# Patient Record
Sex: Female | Born: 1955 | Race: White | Hispanic: No | Marital: Married | Smoking: Never smoker
Health system: Southern US, Community
[De-identification: ages and names within clinical notes are randomized; demographics above are authoritative.]

## PROBLEM LIST (undated history)

## (undated) DIAGNOSIS — N393 Stress incontinence (female) (male): Secondary | ICD-10-CM

## (undated) DIAGNOSIS — IMO0002 Reserved for concepts with insufficient information to code with codable children: Secondary | ICD-10-CM

## (undated) DIAGNOSIS — N841 Polyp of cervix uteri: Secondary | ICD-10-CM

## (undated) HISTORY — DX: Reserved for concepts with insufficient information to code with codable children: IMO0002

## (undated) HISTORY — PX: CYSTOSCOPY: SUR368

## (undated) HISTORY — PX: KNEE SURGERY: SHX244

## (undated) HISTORY — DX: Stress incontinence (female) (male): N39.3

## (undated) HISTORY — DX: Polyp of cervix uteri: N84.1

---

## 1999-04-28 ENCOUNTER — Other Ambulatory Visit: Admission: RE | Admit: 1999-04-28 | Discharge: 1999-04-28 | Payer: Self-pay | Admitting: Obstetrics & Gynecology

## 1999-06-02 ENCOUNTER — Encounter: Admission: RE | Admit: 1999-06-02 | Discharge: 1999-06-02 | Payer: Self-pay | Admitting: Obstetrics & Gynecology

## 1999-06-02 ENCOUNTER — Encounter: Payer: Self-pay | Admitting: Obstetrics & Gynecology

## 2001-01-26 ENCOUNTER — Other Ambulatory Visit: Admission: RE | Admit: 2001-01-26 | Discharge: 2001-01-26 | Payer: Self-pay | Admitting: Obstetrics & Gynecology

## 2001-05-02 ENCOUNTER — Encounter: Payer: Self-pay | Admitting: Obstetrics & Gynecology

## 2001-05-02 ENCOUNTER — Encounter: Admission: RE | Admit: 2001-05-02 | Discharge: 2001-05-02 | Payer: Self-pay | Admitting: Obstetrics & Gynecology

## 2002-06-25 ENCOUNTER — Other Ambulatory Visit: Admission: RE | Admit: 2002-06-25 | Discharge: 2002-06-25 | Payer: Self-pay | Admitting: Obstetrics & Gynecology

## 2003-02-14 ENCOUNTER — Encounter: Payer: Self-pay | Admitting: Obstetrics & Gynecology

## 2003-02-14 ENCOUNTER — Encounter: Admission: RE | Admit: 2003-02-14 | Discharge: 2003-02-14 | Payer: Self-pay | Admitting: Obstetrics & Gynecology

## 2004-06-15 ENCOUNTER — Encounter: Admission: RE | Admit: 2004-06-15 | Discharge: 2004-06-15 | Payer: Self-pay | Admitting: Obstetrics & Gynecology

## 2005-09-27 ENCOUNTER — Encounter: Admission: RE | Admit: 2005-09-27 | Discharge: 2005-09-27 | Payer: Self-pay | Admitting: Obstetrics & Gynecology

## 2006-12-19 ENCOUNTER — Encounter: Admission: RE | Admit: 2006-12-19 | Discharge: 2006-12-19 | Payer: Self-pay | Admitting: Obstetrics & Gynecology

## 2007-11-20 ENCOUNTER — Ambulatory Visit: Payer: Self-pay | Admitting: Gastroenterology

## 2007-12-04 ENCOUNTER — Ambulatory Visit: Payer: Self-pay | Admitting: Gastroenterology

## 2007-12-04 HISTORY — PX: COLONOSCOPY: SHX174

## 2008-02-22 ENCOUNTER — Encounter: Admission: RE | Admit: 2008-02-22 | Discharge: 2008-02-22 | Payer: Self-pay | Admitting: Family Medicine

## 2008-07-22 ENCOUNTER — Encounter: Admission: RE | Admit: 2008-07-22 | Discharge: 2008-07-22 | Payer: Self-pay | Admitting: Obstetrics & Gynecology

## 2010-01-05 ENCOUNTER — Encounter: Admission: RE | Admit: 2010-01-05 | Discharge: 2010-01-05 | Payer: Self-pay | Admitting: Obstetrics & Gynecology

## 2011-05-10 ENCOUNTER — Other Ambulatory Visit: Payer: Self-pay | Admitting: Obstetrics & Gynecology

## 2011-05-10 DIAGNOSIS — Z1231 Encounter for screening mammogram for malignant neoplasm of breast: Secondary | ICD-10-CM

## 2011-05-17 ENCOUNTER — Ambulatory Visit
Admission: RE | Admit: 2011-05-17 | Discharge: 2011-05-17 | Disposition: A | Discharge status: Home / Self Care | Payer: BC Managed Care – PPO | Source: Ambulatory Visit | Attending: Obstetrics & Gynecology | Admitting: Obstetrics & Gynecology

## 2011-05-17 DIAGNOSIS — Z1231 Encounter for screening mammogram for malignant neoplasm of breast: Secondary | ICD-10-CM

## 2011-06-25 ENCOUNTER — Ambulatory Visit (INDEPENDENT_AMBULATORY_CARE_PROVIDER_SITE_OTHER): Payer: BC Managed Care – PPO | Admitting: General Surgery

## 2011-06-28 ENCOUNTER — Encounter (INDEPENDENT_AMBULATORY_CARE_PROVIDER_SITE_OTHER): Payer: Self-pay | Admitting: General Surgery

## 2011-06-28 ENCOUNTER — Ambulatory Visit (INDEPENDENT_AMBULATORY_CARE_PROVIDER_SITE_OTHER): Payer: BC Managed Care – PPO | Admitting: General Surgery

## 2011-06-28 ENCOUNTER — Encounter (INDEPENDENT_AMBULATORY_CARE_PROVIDER_SITE_OTHER): Payer: Self-pay

## 2011-06-28 VITALS — BP 116/82 | HR 85 | Temp 97.2°F | Ht 67.0 in | Wt 185.0 lb

## 2011-06-28 DIAGNOSIS — K644 Residual hemorrhoidal skin tags: Secondary | ICD-10-CM | POA: Insufficient documentation

## 2011-06-28 MED ORDER — HYDROCORTISONE 2.5 % RE CREA: TOPICAL_CREAM | Freq: Three times a day (TID) | RECTAL | Status: AC

## 2011-06-28 MED ORDER — DOCUSATE SODIUM 100 MG PO CAPS: 100.0000 mg | ORAL_CAPSULE | Freq: Two times a day (BID) | ORAL | Status: AC

## 2011-06-28 NOTE — Progress Notes (Signed)
Chief Complaint  Patient presents with  . Pre-op Exam    eval hems    HISTORY: Pt presents with hemorrhoids times 29 years.  She has had problems on and off since the birth of her first child.  She has bleeding when she wipes.  She also has some itching and occasional burning.  She has tried Tucks medicated wipes.  She eats a lot of fiber, does not strain to have bowel movements, and denies having hard stools.  She denies pain.  She has had a colonoscopy that was negative for masses.  She is otherwise well.    Past Medical History  Diagnosis Date  . Cervical polyp   . Cystitis   . Cystocele   . Stress incontinence   . Hemorrhoids   . Rubella   . Frequency of urination and polyuria     Past Surgical History  Procedure Date  . Pubovaginal sing   . Anterior and posterior repair   . Cystoscopy   . Knee surgery     Current Outpatient Prescriptions  Medication Sig Dispense Refill  . Cholecalciferol (VITAMIN D PO) Take by mouth.      . Multiple Vitamins-Minerals (CENTRUM PO) Take by mouth.      . docusate sodium (COLACE) 100 MG capsule Take 1 capsule (100 mg total) by mouth 2 (two) times daily.  60 capsule  3  . hydrocortisone (ANUSOL-HC) 2.5 % rectal cream Place rectally 3 (three) times daily.  30 g  3     No Known Allergies   History reviewed. No pertinent family history.   History   Social History  . Marital Status: Married    Spouse Name: N/A    Number of Children: N/A  . Years of Education: N/A   Social History Main Topics  . Smoking status: Never Smoker   . Smokeless tobacco: None  . Alcohol Use: No  . Drug Use: No  . Sexually Active:     REVIEW OF SYSTEMS - PERTINENT POSITIVES ONLY: 12 point review of systems negative other than HPI and PMH  EXAM: Filed Vitals:   06/28/11 0859  BP: 116/82  Pulse: 85  Temp: 97.2 F (36.2 C)    Gen:  No acute distress.  Well nourished and well groomed.   Neurological: Alert and oriented to person, place, and time.  Coordination normal.  Head: Normocephalic and atraumatic.  Eyes: Conjunctivae are normal. Pupils are equal, round, and reactive to light. No scleral icterus.  Cardiovascular: Normal rate, regular rhythm.   Respiratory: Effort normal.  No respiratory distress.  GI: Soft. Bowel sounds are normal. The abdomen is soft and nontender.   Musculoskeletal: Normal range of motion.  Lymphadenopathy: No cervical, preauricular, postauricular or axillary adenopathy is present Skin: Skin is warm and dry. No rash noted. No diaphoresis. No erythema. No pallor. No clubbing, cyanosis, or edema.   Psychiatric: Normal mood and affect. Behavior is normal. Judgment and thought content normal.     ASSESSMENT AND PLAN: External hemorrhoid, bleeding We will try anusol HC cream for 6-8 weeks. I suspect that this hemorrhoid may be too pedunculated to completely go away.  If it is still bothering her, I offered in office vs OR hemorrhoidectomy. We will do stool softener in addition. I reviewed the pathophysiology of hemorrhoids as well as non operative ways to improve symptoms.    Maudry Diego MD Surgical Oncology, General and Endocrine Surgery [facility]   Visit Diagnoses: 1. External hemorrhoid, bleeding  Primary Care Physician: Astrid Divine, MD, MD

## 2011-06-28 NOTE — Assessment & Plan Note (Signed)
We will try anusol HC cream for 6-8 weeks. I suspect that this hemorrhoid may be too pedunculated to completely go away.  If it is still bothering her, I offered in office vs OR hemorrhoidectomy. We will do stool softener in addition. I reviewed the pathophysiology of hemorrhoids as well as non operative ways to improve symptoms.

## 2011-06-28 NOTE — Patient Instructions (Signed)
Hemorrhoids  Hemorrhoids are enlarged (dilated) veins around the rectum. There are 2 types of hemorrhoids, and the type of hemorrhoid is determined by its location. Internal hemorrhoids occur in the veins just inside the rectum.They are usually not painful, but they may bleed.However, they may poke through to the outside and become irritated and painful. External hemorrhoids involve the veins outside the anus and can be felt as a painful swelling or hard lump near the anus.They are often itchy and may crack and bleed. Sometimes clots will form in the veins. This makes them swollen and painful. These are called thrombosed hemorrhoids. CAUSES Causes of hemorrhoids include:  Pregnancy. This increases the pressure in the hemorrhoidal veins.   Constipation.   Straining to have a bowel movement.   Obesity.   Heavy lifting or other activity that caused you to strain.   TREATMENT Most of the time hemorrhoids improve in 1 to 2 weeks. However, if symptoms do not seem to be getting better or if you have a lot of rectal bleeding, your caregiver may perform a procedure to help make the hemorrhoids get smaller or remove them completely.Possible treatments include:  Rubber band ligation. A rubber band is placed at the base of the hemorrhoid to cut off the circulation.   Sclerotherapy. A chemical is injected to shrink the hemorrhoid.   Infrared light therapy. Tools are used to burn the hemorrhoid.   Hemorrhoidectomy. This is surgical removal of the hemorrhoid.   HOME CARE INSTRUCTIONS   Increase fiber in your diet. Ask your caregiver about using fiber supplements.   Drink enough water and fluids to keep your urine clear or pale yellow.   Exercise regularly.   Go to the bathroom when you have the urge to have a bowel movement. Do not wait.   Avoid straining to have bowel movements.   Keep the anal area dry and clean.   Only take over-the-counter or prescription medicines for pain,  discomfort, or fever as directed by your caregiver.   Take warm sitz baths for 20 to 30 minutes, 3 to 4 times per day.   If the hemorrhoids are very tender and swollen, place ice packs on the area as tolerated. Using ice packs between sitz baths may be helpful. Fill a plastic bag with ice. Place a towel between the bag of ice and your skin.   Medicated creams and suppositories may be used or applied as directed.   Do not use a donut-shaped pillow or sit on the toilet for long periods. This increases blood pooling and pain.   SEEK MEDICAL CARE IF:   You have increasing pain and swelling that is not controlled with your medicine.   You have uncontrolled bleeding.   You have difficulty or you are unable to have a bowel movement.   You have pain or inflammation outside the area of the hemorrhoids.   You have chills or an oral temperature above 102 F (38.9 C).     

## 2013-05-24 ENCOUNTER — Other Ambulatory Visit: Payer: Self-pay

## 2013-05-24 DIAGNOSIS — Z1231 Encounter for screening mammogram for malignant neoplasm of breast: Secondary | ICD-10-CM

## 2013-06-11 ENCOUNTER — Ambulatory Visit
Admission: RE | Admit: 2013-06-11 | Discharge: 2013-06-11 | Disposition: A | Discharge status: Home / Self Care | Payer: BC Managed Care – PPO | Source: Ambulatory Visit

## 2013-06-11 DIAGNOSIS — Z1231 Encounter for screening mammogram for malignant neoplasm of breast: Secondary | ICD-10-CM

## 2013-06-14 ENCOUNTER — Other Ambulatory Visit: Payer: Self-pay | Admitting: Obstetrics & Gynecology

## 2013-06-14 DIAGNOSIS — R928 Other abnormal and inconclusive findings on diagnostic imaging of breast: Secondary | ICD-10-CM

## 2013-06-28 ENCOUNTER — Ambulatory Visit
Admission: RE | Admit: 2013-06-28 | Discharge: 2013-06-28 | Disposition: A | Discharge status: Home / Self Care | Payer: BC Managed Care – PPO | Source: Ambulatory Visit | Attending: Obstetrics & Gynecology | Admitting: Obstetrics & Gynecology

## 2013-06-28 DIAGNOSIS — R928 Other abnormal and inconclusive findings on diagnostic imaging of breast: Secondary | ICD-10-CM

## 2015-02-24 ENCOUNTER — Encounter: Payer: Self-pay | Admitting: Gastroenterology

## 2015-12-30 DIAGNOSIS — H10211 Acute toxic conjunctivitis, right eye: Secondary | ICD-10-CM | POA: Diagnosis not present

## 2015-12-30 DIAGNOSIS — H04123 Dry eye syndrome of bilateral lacrimal glands: Secondary | ICD-10-CM | POA: Diagnosis not present

## 2016-01-06 DIAGNOSIS — H2513 Age-related nuclear cataract, bilateral: Secondary | ICD-10-CM | POA: Diagnosis not present

## 2016-01-06 DIAGNOSIS — H04123 Dry eye syndrome of bilateral lacrimal glands: Secondary | ICD-10-CM | POA: Diagnosis not present

## 2016-01-06 DIAGNOSIS — H10211 Acute toxic conjunctivitis, right eye: Secondary | ICD-10-CM | POA: Diagnosis not present

## 2016-01-23 DIAGNOSIS — H33103 Unspecified retinoschisis, bilateral: Secondary | ICD-10-CM | POA: Diagnosis not present

## 2016-01-23 DIAGNOSIS — H21342 Primary cyst of pars plana, left eye: Secondary | ICD-10-CM | POA: Diagnosis not present

## 2016-02-12 DIAGNOSIS — Z23 Encounter for immunization: Secondary | ICD-10-CM | POA: Diagnosis not present

## 2016-07-19 DIAGNOSIS — H35421 Microcystoid degeneration of retina, right eye: Secondary | ICD-10-CM | POA: Diagnosis not present

## 2016-07-19 DIAGNOSIS — H21342 Primary cyst of pars plana, left eye: Secondary | ICD-10-CM | POA: Diagnosis not present

## 2016-07-19 DIAGNOSIS — H33103 Unspecified retinoschisis, bilateral: Secondary | ICD-10-CM | POA: Diagnosis not present

## 2016-11-24 DIAGNOSIS — Z23 Encounter for immunization: Secondary | ICD-10-CM | POA: Diagnosis not present

## 2017-01-20 DIAGNOSIS — Z23 Encounter for immunization: Secondary | ICD-10-CM | POA: Diagnosis not present

## 2017-03-24 DIAGNOSIS — Z23 Encounter for immunization: Secondary | ICD-10-CM | POA: Diagnosis not present

## 2017-06-24 DIAGNOSIS — S01112A Laceration without foreign body of left eyelid and periocular area, initial encounter: Secondary | ICD-10-CM | POA: Diagnosis not present

## 2017-07-25 DIAGNOSIS — H33103 Unspecified retinoschisis, bilateral: Secondary | ICD-10-CM | POA: Diagnosis not present

## 2017-07-25 DIAGNOSIS — H21342 Primary cyst of pars plana, left eye: Secondary | ICD-10-CM | POA: Diagnosis not present

## 2017-07-25 DIAGNOSIS — H35421 Microcystoid degeneration of retina, right eye: Secondary | ICD-10-CM | POA: Diagnosis not present

## 2017-07-29 ENCOUNTER — Encounter: Payer: Self-pay | Admitting: Obstetrics & Gynecology

## 2017-07-29 ENCOUNTER — Ambulatory Visit (INDEPENDENT_AMBULATORY_CARE_PROVIDER_SITE_OTHER): Payer: BLUE CROSS/BLUE SHIELD | Admitting: Obstetrics & Gynecology

## 2017-07-29 VITALS — BP 122/78 | Ht 67.0 in | Wt 187.0 lb

## 2017-07-29 DIAGNOSIS — Z789 Other specified health status: Secondary | ICD-10-CM

## 2017-07-29 DIAGNOSIS — N951 Menopausal and female climacteric states: Secondary | ICD-10-CM

## 2017-07-29 DIAGNOSIS — N914 Secondary oligomenorrhea: Secondary | ICD-10-CM

## 2017-07-29 DIAGNOSIS — Z01419 Encounter for gynecological examination (general) (routine) without abnormal findings: Secondary | ICD-10-CM | POA: Diagnosis not present

## 2017-07-29 NOTE — Addendum Note (Signed)
Addended by: Thurnell Garbe A on: 07/29/2017 12:47 PM   Modules accepted: Orders

## 2017-07-29 NOTE — Progress Notes (Signed)
Misty Andrews [birth year] [id number]   History:    62 y.o. G3P3L3 Martried.  Has grand-children.  RP:  Established patient presenting for annual gyn exam   HPI: Perimenopausal with menses every 1-3 months.  Usually normal flow for 5 days.  One episode of bleeding for 3 weeks after skipping for 3 months.  No vasomotor menopausal symptoms.  No pelvic pain.  Normal vaginal secretions.  Occasionally sexually active with no pain.  Urine normal.  No stress urinary incontinence.  Bowel movements normal.  Breasts normal.  Health labs with family physician.  Past medical history,surgical history, family history and social history were all reviewed and documented in the EPIC chart.  Gynecologic History Contraception: condoms Last Pap: 12/2014. Results were: Negative/HPV HR neg Last mammogram: 06/2015. Results were: Negative Bone Density: Never Colonoscopy: 2009, will schedule now  Obstetric History OB History  Gravida Para Term Preterm AB Living  3 3       3   SAB TAB Ectopic Multiple Live Births               # Outcome Date GA Lbr Len/2nd Weight Sex Delivery Anes PTL Lv  3 Para           2 Para           1 Para              ROS: A ROS was performed and pertinent positives and negatives are included in the history.  GENERAL: No fevers or chills. HEENT: No change in vision, no earache, sore throat or sinus congestion. NECK: No pain or stiffness. CARDIOVASCULAR: No chest pain or pressure. No palpitations. PULMONARY: No shortness of breath, cough or wheeze. GASTROINTESTINAL: No abdominal pain, nausea, vomiting or diarrhea, melena or bright red blood per rectum. GENITOURINARY: No urinary frequency, urgency, hesitancy or dysuria. MUSCULOSKELETAL: No joint or muscle pain, no back pain, no recent trauma. DERMATOLOGIC: No rash, no itching, no lesions. ENDOCRINE: No polyuria, polydipsia, no heat or cold intolerance. No recent change in weight. HEMATOLOGICAL: No anemia or easy bruising or bleeding.  NEUROLOGIC: No headache, seizures, numbness, tingling or weakness. PSYCHIATRIC: No depression, no loss of interest in normal activity or change in sleep pattern.     Exam:   BP 122/78   Ht 5\' 7"  (1.702 m)   Wt 187 lb (84.8 kg)   BMI 29.29 kg/m   Body mass index is 29.29 kg/m.  General appearance : Well developed well nourished female. No acute distress HEENT: Eyes: no retinal hemorrhage or exudates,  Neck supple, trachea midline, no carotid bruits, no thyroidmegaly Lungs: Clear to auscultation, no rhonchi or wheezes, or rib retractions  Heart: Regular rate and rhythm, no murmurs or gallops Breast:Examined in sitting and supine position were symmetrical in appearance, no palpable masses or tenderness,  no skin retraction, no nipple inversion, no nipple discharge, no skin discoloration, no axillary or supraclavicular lymphadenopathy Abdomen: no palpable masses or tenderness, no rebound or guarding Extremities: no edema or skin discoloration or tenderness  Pelvic: Vulva: Normal             Vagina: No gross lesions or discharge  Cervix: No gross lesions or discharge.  Pap reflex done  Uterus  AV, normal size, shape and consistency, non-tender and mobile  Adnexa  Without masses or tenderness  Anus: Normal   Assessment/Plan:  62 y.o. female for annual exam   1. Encounter for routine gynecological examination with Papanicolaou smear of cervix Normal gynecologic exam.  Pap reflex done.  Breast exam normal.  Will schedule screening mammogram now.  Health labs with family physician.  Patient will organize screening colonoscopy now.  2. Perimenopause Still having menstrual periods every 1-3 months.  After skipping for 3 months, had prolonged bleeding for 3 weeks.  Will do an [facility] today to confirm that patient is not in menopause with postmenopausal bleeding.  Will also follow up with a pelvic ultrasound to assess the endometrium for possible pathology. [facility] - [place] Transvaginal Non-OB;  Future  3. Secondary oligomenorrhea As above. - [facility] - [place] Transvaginal Non-OB; Future  4. Use of condoms for contraception Recommend continued use of condoms until reaches full menopause.  Counseling on above issues and coordination of care more than 50% for 10 minutes.  Princess Bruins MD, 11:28 AM 07/29/2017

## 2017-07-29 NOTE — Patient Instructions (Signed)
1. Encounter for routine gynecological examination with Papanicolaou smear of cervix Normal gynecologic exam.  Pap reflex done.  Breast exam normal.  Will schedule screening mammogram now.  Health labs with family physician.  Patient will organize screening colonoscopy now.  2. Perimenopause Still having menstrual periods every 1-3 months.  After skipping for 3 months, had prolonged bleeding for 3 weeks.  Will do an [facility] today to confirm that patient is not in menopause with postmenopausal bleeding.  Will also follow up with a pelvic ultrasound to assess the endometrium for possible pathology. [facility] - US Transvaginal Non-OB; Future  3. Secondary oligomenorrhea As above. - [facility] - US Transvaginal Non-OB; Future  4. Use of condoms for contraception Recommend continued use of condoms until reaches full menopause.  Becki, it was a pleasure seeing you today!  I will inform you of your results as soon as they are available.  Perimenopause Perimenopause is the time when your body begins to move into the menopause (no menstrual period for 12 straight months). It is a natural process. Perimenopause can begin 2-8 years before the menopause and usually lasts for 1 year after the menopause. During this time, your ovaries may or may not produce an egg. The ovaries vary in their production of estrogen and progesterone hormones each month. This can cause irregular menstrual periods, difficulty getting pregnant, vaginal bleeding between periods, and uncomfortable symptoms. What are the causes?  Irregular production of the ovarian hormones, estrogen and progesterone, and not ovulating every month. Other causes include:  Tumor of the pituitary gland in the brain.  Medical disease that affects the ovaries.  Radiation treatment.  Chemotherapy.  Unknown causes.  Heavy smoking and excessive alcohol intake can bring on perimenopause sooner.  What are the signs or symptoms?  Hot flashes.  Night  sweats.  Irregular menstrual periods.  Decreased sex drive.  Vaginal dryness.  Headaches.  Mood swings.  Depression.  Memory problems.  Irritability.  Tiredness.  Weight gain.  Trouble getting pregnant.  The beginning of losing bone cells (osteoporosis).  The beginning of hardening of the arteries (atherosclerosis). How is this diagnosed? Your health care provider will make a diagnosis by analyzing your age, menstrual history, and symptoms. He or she will do a physical exam and note any changes in your body, especially your female organs. Female hormone tests may or may not be helpful depending on the amount of female hormones you produce and when you produce them. However, other hormone tests may be helpful to rule out other problems. How is this treated? In some cases, no treatment is needed. The decision on whether treatment is necessary during the perimenopause should be made by you and your health care provider based on how the symptoms are affecting you and your lifestyle. Various treatments are available, such as:  Treating individual symptoms with a specific medicine for that symptom.  Herbal medicines that can help specific symptoms.  Counseling.  Group therapy.  Follow these instructions at home:  Keep track of your menstrual periods (when they occur, how heavy they are, how long between periods, and how long they last) as well as your symptoms and when they started.  Only take over-the-counter or prescription medicines as directed by your health care provider.  Sleep and rest.  Exercise.  Eat a diet that contains calcium (good for your bones) and soy (acts like the estrogen hormone).  Do not smoke.  Avoid alcoholic beverages.  Take vitamin supplements as recommended by your health  care provider. Taking vitamin E may help in certain cases.  Take calcium and vitamin D supplements to help prevent bone loss.  Group therapy is sometimes  helpful.  Acupuncture may help in some cases. Contact a health care provider if:  You have questions about any symptoms you are having.  You need a referral to a specialist (gynecologist, psychiatrist, or psychologist). Get help right away if:  You have vaginal bleeding.  Your period lasts longer than 8 days.  Your periods are recurring sooner than 21 days.  You have bleeding after intercourse.  You have severe depression.  You have pain when you urinate.  You have severe headaches.  You have vision problems. This information is not intended to replace advice given to you by your health care provider. Make sure you discuss any questions you have with your health care provider. Document Released: 05/20/2004 Document Revised: 09/18/2015 Document Reviewed: 11/09/2012 Elsevier Interactive Patient Education  2017 [place] Maintenance, Female Adopting a healthy lifestyle and getting preventive care can go a long way to promote health and wellness. Talk with your health care provider about what schedule of regular examinations is right for you. This is a good chance for you to check in with your provider about disease prevention and staying healthy. In between checkups, there are plenty of things you can do on your own. Experts have done a lot of research about which lifestyle changes and preventive measures are most likely to keep you healthy. Ask your health care provider for more information. Weight and diet Eat a healthy diet  Be sure to include plenty of vegetables, fruits, low-fat dairy products, and lean protein.  Do not eat a lot of foods high in solid fats, added sugars, or salt.  Get regular exercise. This is one of the most important things you can do for your health. ? Most adults should exercise for at least 150 minutes each week. The exercise should increase your heart rate and make you sweat (moderate-intensity exercise). ? Most adults should also do  strengthening exercises at least twice a week. This is in addition to the moderate-intensity exercise.  Maintain a healthy weight  Body mass index (BMI) is a measurement that can be used to identify possible weight problems. It estimates body fat based on height and weight. Your health care provider can help determine your BMI and help you achieve or maintain a healthy weight.  For females 25 years of age and older: ? A BMI below 18.5 is considered underweight. ? A BMI of 18.5 to 24.9 is normal. ? A BMI of 25 to 29.9 is considered overweight. ? A BMI of 30 and above is considered obese.  Watch levels of cholesterol and blood lipids  You should start having your blood tested for lipids and cholesterol at 60 years of age, then have this test every 5 years.  You may need to have your cholesterol levels checked more often if: ? Your lipid or cholesterol levels are high. ? You are older than 60 years of age. ? You are at high risk for heart disease.  Cancer screening Lung Cancer  Lung cancer screening is recommended for adults 45-88 years old who are at high risk for lung cancer because of a history of smoking.  A yearly low-dose CT scan of the lungs is recommended for people who: ? Currently smoke. ? Have quit within the past 15 years. ? Have at least a 30-pack-year history of smoking. A pack year  is smoking an average of one pack of cigarettes a day for 1 year.  Yearly screening should continue until it has been 15 years since you quit.  Yearly screening should stop if you develop a health problem that would prevent you from having lung cancer treatment.  Breast Cancer  Practice breast self-awareness. This means understanding how your breasts normally appear and feel.  It also means doing regular breast self-exams. Let your health care provider know about any changes, no matter how small.  If you are in your 20s or 30s, you should have a clinical breast exam (CBE) by a health  care provider every 1-3 years as part of a regular health exam.  If you are 23 or older, have a CBE every year. Also consider having a breast X-ray (mammogram) every year.  If you have a family history of breast cancer, talk to your health care provider about genetic screening.  If you are at high risk for breast cancer, talk to your health care provider about having an MRI and a mammogram every year.  Breast cancer gene (BRCA) assessment is recommended for women who have family members with BRCA-related cancers. BRCA-related cancers include: ? Breast. ? Ovarian. ? Tubal. ? Peritoneal cancers.  Results of the assessment will determine the need for genetic counseling and BRCA1 and BRCA2 testing.  Cervical Cancer Your health care provider may recommend that you be screened regularly for cancer of the pelvic organs (ovaries, uterus, and vagina). This screening involves a pelvic examination, including checking for microscopic changes to the surface of your cervix (Pap test). You may be encouraged to have this screening done every 3 years, beginning at age 69.  For women ages 3-65, health care providers may recommend pelvic exams and Pap testing every 3 years, or they may recommend the Pap and pelvic exam, combined with testing for human papilloma virus (HPV), every 5 years. Some types of HPV increase your risk of cervical cancer. Testing for HPV may also be done on women of any age with unclear Pap test results.  Other health care providers may not recommend any screening for nonpregnant women who are considered low risk for pelvic cancer and who do not have symptoms. Ask your health care provider if a screening pelvic exam is right for you.  If you have had past treatment for cervical cancer or a condition that could lead to cancer, you need Pap tests and screening for cancer for at least 20 years after your treatment. If Pap tests have been discontinued, your risk factors (such as having a new  sexual partner) need to be reassessed to determine if screening should resume. Some women have medical problems that increase the chance of getting cervical cancer. In these cases, your health care provider may recommend more frequent screening and Pap tests.  Colorectal Cancer  This type of cancer can be detected and often prevented.  Routine colorectal cancer screening usually begins at 60 years of age and continues through 61 years of age.  Your health care provider may recommend screening at an earlier age if you have risk factors for colon cancer.  Your health care provider may also recommend using home test kits to check for hidden blood in the stool.  A small camera at the end of a tube can be used to examine your colon directly (sigmoidoscopy or colonoscopy). This is done to check for the earliest forms of colorectal cancer.  Routine screening usually begins at age 77.  Direct  examination of the colon should be repeated every 5-10 years through 60 years of age. However, you may need to be screened more often if early forms of precancerous polyps or small growths are found.  Skin Cancer  Check your skin from head to toe regularly.  Tell your health care provider about any new moles or changes in moles, especially if there is a change in a mole's shape or color.  Also tell your health care provider if you have a mole that is larger than the size of a pencil eraser.  Always use sunscreen. Apply sunscreen liberally and repeatedly throughout the day.  Protect yourself by wearing long sleeves, pants, a wide-brimmed hat, and sunglasses whenever you are outside.  Heart disease, diabetes, and high blood pressure  High blood pressure causes heart disease and increases the risk of stroke. High blood pressure is more likely to develop in: ? People who have blood pressure in the high end of the normal range (130-139/85-89 mm Hg). ? People who are overweight or obese. ? People who are  African American.  If you are 62-14 years of age, have your blood pressure checked every 3-5 years. If you are 20 years of age or older, have your blood pressure checked every year. You should have your blood pressure measured twice-once when you are at a hospital or clinic, and once when you are not at a hospital or clinic. Record the average of the two measurements. To check your blood pressure when you are not at a hospital or clinic, you can use: ? An automated blood pressure machine at a pharmacy. ? A home blood pressure monitor.  If you are between 70 years and 9 years old, ask your health care provider if you should take aspirin to prevent strokes.  Have regular diabetes screenings. This involves taking a blood sample to check your fasting blood sugar level. ? If you are at a normal weight and have a low risk for diabetes, have this test once every three years after 60 years of age. ? If you are overweight and have a high risk for diabetes, consider being tested at a younger age or more often. Preventing infection Hepatitis B  If you have a higher risk for hepatitis B, you should be screened for this virus. You are considered at high risk for hepatitis B if: ? You were born in a country where hepatitis B is common. Ask your health care provider which countries are considered high risk. ? Your parents were born in a high-risk country, and you have not been immunized against hepatitis B (hepatitis B vaccine). ? You have HIV or AIDS. ? You use needles to inject street drugs. ? You live with someone who has hepatitis B. ? You have had sex with someone who has hepatitis B. ? You get hemodialysis treatment. ? You take certain medicines for conditions, including cancer, organ transplantation, and autoimmune conditions.  Hepatitis C  Blood testing is recommended for: ? Everyone born from [birth year] through 1963. ? Anyone with known risk factors for hepatitis C.  Sexually transmitted  infections (STIs)  You should be screened for sexually transmitted infections (STIs) including gonorrhea and chlamydia if: ? You are sexually active and are younger than 60 years of age. ? You are older than 60 years of age and your health care provider tells you that you are at risk for this type of infection. ? Your sexual activity has changed since you were last screened and you  are at an increased risk for chlamydia or gonorrhea. Ask your health care provider if you are at risk.  If you do not have HIV, but are at risk, it may be recommended that you take a prescription medicine daily to prevent HIV infection. This is called pre-exposure prophylaxis (PrEP). You are considered at risk if: ? You are sexually active and do not regularly use condoms or know the HIV status of your partner(s). ? You take drugs by injection. ? You are sexually active with a partner who has HIV.  Talk with your health care provider about whether you are at high risk of being infected with HIV. If you choose to begin PrEP, you should first be tested for HIV. You should then be tested every 3 months for as long as you are taking PrEP. Pregnancy  If you are premenopausal and you may become pregnant, ask your health care provider about preconception counseling.  If you may become pregnant, take 400 to 800 micrograms (mcg) of folic acid every day.  If you want to prevent pregnancy, talk to your health care provider about birth control (contraception). Osteoporosis and menopause  Osteoporosis is a disease in which the bones lose minerals and strength with aging. This can result in serious bone fractures. Your risk for osteoporosis can be identified using a bone density scan.  If you are 3 years of age or older, or if you are at risk for osteoporosis and fractures, ask your health care provider if you should be screened.  Ask your health care provider whether you should take a calcium or vitamin D supplement to lower  your risk for osteoporosis.  Menopause may have certain physical symptoms and risks.  Hormone replacement therapy may reduce some of these symptoms and risks. Talk to your health care provider about whether hormone replacement therapy is right for you. Follow these instructions at home:  Schedule regular health, dental, and eye exams.  Stay current with your immunizations.  Do not use any tobacco products including cigarettes, chewing tobacco, or electronic cigarettes.  If you are pregnant, do not drink alcohol.  If you are breastfeeding, limit how much and how often you drink alcohol.  Limit alcohol intake to no more than 1 drink per day for nonpregnant women. One drink equals 12 ounces of beer, 5 ounces of wine, or 1 ounces of hard liquor.  Do not use street drugs.  Do not share needles.  Ask your health care provider for help if you need support or information about quitting drugs.  Tell your health care provider if you often feel depressed.  Tell your health care provider if you have ever been abused or do not feel safe at home. This information is not intended to replace advice given to you by your health care provider. Make sure you discuss any questions you have with your health care provider. Document Released: 10/26/2010 Document Revised: 09/18/2015 Document Reviewed: 01/14/2015 Elsevier Interactive Patient Education  [employer].

## 2017-07-30 LAB — FOLLICLE STIMULATING HORMONE: FSH: 6 m[IU]/mL

## 2017-08-01 ENCOUNTER — Other Ambulatory Visit: Payer: Self-pay | Admitting: Obstetrics & Gynecology

## 2017-08-01 DIAGNOSIS — Z139 Encounter for screening, unspecified: Secondary | ICD-10-CM

## 2017-08-02 ENCOUNTER — Ambulatory Visit (INDEPENDENT_AMBULATORY_CARE_PROVIDER_SITE_OTHER): Payer: BLUE CROSS/BLUE SHIELD

## 2017-08-02 ENCOUNTER — Ambulatory Visit
Admission: RE | Admit: 2017-08-02 | Discharge: 2017-08-02 | Disposition: A | Discharge status: Home / Self Care | Payer: BLUE CROSS/BLUE SHIELD | Source: Ambulatory Visit | Attending: Obstetrics & Gynecology | Admitting: Obstetrics & Gynecology

## 2017-08-02 ENCOUNTER — Other Ambulatory Visit: Payer: Self-pay | Admitting: Obstetrics & Gynecology

## 2017-08-02 ENCOUNTER — Ambulatory Visit: Payer: BLUE CROSS/BLUE SHIELD | Admitting: Obstetrics & Gynecology

## 2017-08-02 DIAGNOSIS — N951 Menopausal and female climacteric states: Secondary | ICD-10-CM | POA: Diagnosis not present

## 2017-08-02 DIAGNOSIS — Z1231 Encounter for screening mammogram for malignant neoplasm of breast: Secondary | ICD-10-CM | POA: Diagnosis not present

## 2017-08-02 DIAGNOSIS — R9389 Abnormal findings on diagnostic imaging of other specified body structures: Secondary | ICD-10-CM

## 2017-08-02 DIAGNOSIS — N914 Secondary oligomenorrhea: Secondary | ICD-10-CM | POA: Diagnosis not present

## 2017-08-02 DIAGNOSIS — D251 Intramural leiomyoma of uterus: Secondary | ICD-10-CM

## 2017-08-02 DIAGNOSIS — Q51810 Arcuate uterus: Secondary | ICD-10-CM

## 2017-08-02 DIAGNOSIS — N83202 Unspecified ovarian cyst, left side: Secondary | ICD-10-CM

## 2017-08-02 DIAGNOSIS — Z139 Encounter for screening, unspecified: Secondary | ICD-10-CM

## 2017-08-02 LAB — PAP IG W/ RFLX HPV ASCU

## 2017-08-02 MED ORDER — NORETHINDRONE 0.35 MG PO TABS: 1.0000 | ORAL_TABLET | Freq: Every day | ORAL | 4 refills | Status: DC

## 2017-08-02 NOTE — Progress Notes (Signed)
    Misty Andrews 1955-08-28 [id number]        62 y.o.  G3P3   RP: Oligomenorrhea at age 62 for pelvic US  HPI: FSH at 6.0, not menopausal.  No pelvic pain.   OB History  Gravida Para Term Preterm AB Living  3 3       3   SAB TAB Ectopic Multiple Live Births               # Outcome Date GA Lbr Len/2nd Weight Sex Delivery Anes PTL Lv  3 Para           2 Para           1 Para             Past medical history,surgical history, problem list, medications, allergies, family history and social history were all reviewed and documented in the EPIC chart.   Directed ROS with pertinent positives and negatives documented in the history of present illness/assessment and plan.  Exam:  There were no vitals filed for this visit. General appearance:  Normal  Pelvic US today: T/V and T/A images.  Anteverted arcuate uterus measuring 10.3 x 7.82 x 6.23 cm.  Intramural fibroids measuring 2.5 x 2 cm and 1.9 x 1.8 cm.  Endometrial line at 11.4 mm on the left and at 10.2 mm on the right.  Right ovary normal with a follicle measuring 1.7 x 1.8 cm.  Left small simple ovarian cyst measuring 3.2 x 2.2 x 4.1 cm.  Internal low-level echoes.  Color flow Doppler negative.  No free fluid in the posterior cul-de-sac.   Assessment/Plan:  62 y.o. G3P3   1. Secondary oligomenorrhea Perimenopausal at age 62.  Uterus is arcuate with an endometrial line at 11.4 mm on the left and at 10.2 mm on the right.  Small intramural fibroids 2.5 and 1.9 cm.  Follicle on the right ovary and small simple ovarian cyst at 4.1 cm on the left ovary.  Decision to start on the progestin only pill to protect the endometrium during perimenopause and decrease the risk of recurrent functional ovarian cysts.  Will follow up for a repeat pelvic ultrasound in 3 months to reassess the endometrial line and the left ovarian simple cyst.  Usage, benefits and risks of the progestin only pill reviewed with patient.  Prescription sent to the  pharmacy. - US Transvaginal Non-OB; Future  Other orders - norethindrone (MICRONOR,CAMILA,ERRIN) 0.35 MG tablet; Take 1 tablet (0.35 mg total) by mouth daily.  Counseling on above issues and coordination of care more than 50% for 15 minutes.  Princess Bruins MD, 10:45 AM 08/02/2017

## 2017-08-07 ENCOUNTER — Encounter: Payer: Self-pay | Admitting: Obstetrics & Gynecology

## 2017-08-07 NOTE — Patient Instructions (Signed)
1. Secondary oligomenorrhea Perimenopausal at age 62.  Uterus is arcuate with an endometrial line at 11.4 mm on the left and at 10.2 mm on the right.  Small intramural fibroids 2.5 and 1.9 cm.  Follicle on the right ovary and small simple ovarian cyst at 4.1 cm on the left ovary.  Decision to start on the progestin only pill to protect the endometrium during perimenopause and decrease the risk of recurrent functional ovarian cysts.  Will follow up for a repeat pelvic ultrasound in 3 months to reassess the endometrial line and the left ovarian simple cyst.  Usage, benefits and risks of the progestin only pill reviewed with patient.  Prescription sent to the pharmacy. - US Transvaginal Non-OB; Future  Other orders - norethindrone (MICRONOR,CAMILA,ERRIN) 0.35 MG tablet; Take 1 tablet (0.35 mg total) by mouth daily.  Sharyn Lull, good seeing you today!

## 2017-08-23 ENCOUNTER — Other Ambulatory Visit: Payer: Self-pay | Admitting: Obstetrics & Gynecology

## 2017-08-23 DIAGNOSIS — N914 Secondary oligomenorrhea: Secondary | ICD-10-CM

## 2017-08-23 DIAGNOSIS — Q51810 Arcuate uterus: Secondary | ICD-10-CM

## 2017-08-26 DIAGNOSIS — M1712 Unilateral primary osteoarthritis, left knee: Secondary | ICD-10-CM | POA: Diagnosis not present

## 2017-08-26 DIAGNOSIS — M25562 Pain in left knee: Secondary | ICD-10-CM | POA: Diagnosis not present

## 2017-08-26 DIAGNOSIS — M1711 Unilateral primary osteoarthritis, right knee: Secondary | ICD-10-CM | POA: Diagnosis not present

## 2017-08-26 DIAGNOSIS — M25561 Pain in right knee: Secondary | ICD-10-CM | POA: Diagnosis not present

## 2017-10-02 DIAGNOSIS — J029 Acute pharyngitis, unspecified: Secondary | ICD-10-CM | POA: Diagnosis not present

## 2017-10-02 DIAGNOSIS — R05 Cough: Secondary | ICD-10-CM | POA: Diagnosis not present

## 2017-10-17 ENCOUNTER — Encounter: Payer: Self-pay | Admitting: Gastroenterology

## 2017-10-31 ENCOUNTER — Ambulatory Visit: Payer: BLUE CROSS/BLUE SHIELD | Admitting: Obstetrics & Gynecology

## 2017-10-31 ENCOUNTER — Other Ambulatory Visit: Payer: BLUE CROSS/BLUE SHIELD

## 2017-11-04 ENCOUNTER — Encounter: Payer: Self-pay | Admitting: Obstetrics & Gynecology

## 2017-11-07 ENCOUNTER — Other Ambulatory Visit: Payer: Self-pay | Admitting: Obstetrics & Gynecology

## 2017-11-07 DIAGNOSIS — N83202 Unspecified ovarian cyst, left side: Secondary | ICD-10-CM

## 2017-11-07 DIAGNOSIS — R9389 Abnormal findings on diagnostic imaging of other specified body structures: Secondary | ICD-10-CM

## 2017-11-14 ENCOUNTER — Ambulatory Visit: Payer: BLUE CROSS/BLUE SHIELD | Admitting: Obstetrics & Gynecology

## 2017-11-14 ENCOUNTER — Other Ambulatory Visit: Payer: Self-pay | Admitting: Obstetrics & Gynecology

## 2017-11-14 ENCOUNTER — Ambulatory Visit (INDEPENDENT_AMBULATORY_CARE_PROVIDER_SITE_OTHER): Payer: BLUE CROSS/BLUE SHIELD

## 2017-11-14 ENCOUNTER — Encounter: Payer: Self-pay | Admitting: Obstetrics & Gynecology

## 2017-11-14 VITALS — BP 130/80

## 2017-11-14 DIAGNOSIS — R9389 Abnormal findings on diagnostic imaging of other specified body structures: Secondary | ICD-10-CM

## 2017-11-14 DIAGNOSIS — N83202 Unspecified ovarian cyst, left side: Secondary | ICD-10-CM | POA: Diagnosis not present

## 2017-11-14 DIAGNOSIS — D251 Intramural leiomyoma of uterus: Secondary | ICD-10-CM | POA: Diagnosis not present

## 2017-11-14 DIAGNOSIS — N83201 Unspecified ovarian cyst, right side: Secondary | ICD-10-CM

## 2017-11-14 DIAGNOSIS — N852 Hypertrophy of uterus: Secondary | ICD-10-CM

## 2017-11-14 DIAGNOSIS — N914 Secondary oligomenorrhea: Secondary | ICD-10-CM | POA: Diagnosis not present

## 2017-11-14 NOTE — Progress Notes (Signed)
    Misty Andrews 06/13/1955 [id number]        62 y.o.  G3P3L3  RP: Left ovarian cyst/Oligomenorrhea for f/u Pelvic US  HPI: Still not menopausal at 62 yo.  [place] low at 6.0 in 07/2017.  Menses normal every month on the Progestin-only pill since 07/2017.  No pelvic pain.   OB History  Gravida Para Term Preterm AB Living  3 3       3   SAB TAB Ectopic Multiple Live Births               # Outcome Date GA Lbr Len/2nd Weight Sex Delivery Anes PTL Lv  3 Para           2 Para           1 Para             Past medical history,surgical history, problem list, medications, allergies, family history and social history were all reviewed and documented in the EPIC chart.   Directed ROS with pertinent positives and negatives documented in the history of present illness/assessment and plan.  Exam:  There were no vitals filed for this visit. General appearance:  Normal  Pelvic US today: T/V and T/a images.  Anteverted arcuate uterus measuring 14.10 x 10.22 x 6.18 cm.  Intramural fibroids measuring 2.4 x 1.7 cm, 2.5 x 2.5 cm, 1 cm, 1.3 cm.  Right endometrial line measured at 9 mm and left endometrial line measured at 7.5 mm.  Right ovary with a thin-walled echo-free follicular type cyst measuring 2.8 x 2.8 x 2.1 cm.  Left ovary normal, previous cyst not seen.  Right and left adnexa with no mass seen.  No free fluid in the posterior cul-de-sac.   Assessment/Plan:  62 y.o. G3P3   1. Secondary oligomenorrhea Oligomenorrhea of perimenopause, but FSH at 6.0 in April 2019.  Arcuate uterus with endometrial lines normal and thinner than on last ultrasound on both sides.  Patient reassured.  Small intra-mural fibroids stable.  Will continue on the progesterone only pill.  No contraindication.  2. Left ovarian cyst Left ovarian cyst resolved.  Right ovarian functional cyst at 2.8 cm.  No right or left adnexal mass and no free fluid in the posterior cul-de-sac.  Patient reassured.  Will  observe.  Counseling on above issues and coordination of care more than 50% for 15 minutes.  Princess Bruins MD, 10:33 AM 11/14/2017

## 2017-11-15 ENCOUNTER — Encounter: Payer: Self-pay | Admitting: Obstetrics & Gynecology

## 2017-11-15 NOTE — Patient Instructions (Signed)
1. Secondary oligomenorrhea Oligomenorrhea of perimenopause, but FSH at 6.0 in April 2019.  Arcuate uterus with endometrial lines normal and thinner than on last ultrasound on both sides.  Patient reassured.  Small intra-mural fibroids stable.  Will continue on the progesterone only pill.  No contraindication.  2. Left ovarian cyst Left ovarian cyst resolved.  Right ovarian functional cyst at 2.8 cm.  No right or left adnexal mass and no free fluid in the posterior cul-de-sac.  Patient reassured.  Will observe.  Zophia, it was a pleasure seeing you today!

## 2018-02-03 DIAGNOSIS — Z23 Encounter for immunization: Secondary | ICD-10-CM | POA: Diagnosis not present

## 2018-03-31 ENCOUNTER — Encounter: Payer: Self-pay | Admitting: Gastroenterology

## 2018-05-05 ENCOUNTER — Encounter: Payer: Self-pay | Admitting: Gastroenterology

## 2018-05-05 ENCOUNTER — Ambulatory Visit (AMBULATORY_SURGERY_CENTER): Payer: Self-pay | Admitting: *Deleted

## 2018-05-05 VITALS — Ht 67.0 in | Wt 186.0 lb

## 2018-05-05 DIAGNOSIS — Z1211 Encounter for screening for malignant neoplasm of colon: Secondary | ICD-10-CM

## 2018-05-05 MED ORDER — PEG 3350-KCL-NA BICARB-NACL 420 G PO SOLR: 4000.0000 mL | Freq: Once | ORAL | 0 refills | Status: AC

## 2018-05-05 NOTE — Progress Notes (Signed)
Patient denies any allergies to eggs or soy. Patient denies any problems with anesthesia/sedation. Patient denies any oxygen use at home. Patient denies taking any diet/weight loss medications or blood thinners. EMMI education offered, pt declined.  

## 2018-05-19 ENCOUNTER — Encounter: Payer: Self-pay | Admitting: Gastroenterology

## 2018-05-19 ENCOUNTER — Ambulatory Visit (AMBULATORY_SURGERY_CENTER): Payer: BLUE CROSS/BLUE SHIELD | Admitting: Gastroenterology

## 2018-05-19 VITALS — BP 110/70 | HR 70 | Temp 98.4°F | Resp 17 | Ht 67.0 in | Wt 187.0 lb

## 2018-05-19 DIAGNOSIS — K635 Polyp of colon: Secondary | ICD-10-CM | POA: Diagnosis not present

## 2018-05-19 DIAGNOSIS — Z1211 Encounter for screening for malignant neoplasm of colon: Secondary | ICD-10-CM | POA: Diagnosis not present

## 2018-05-19 DIAGNOSIS — D125 Benign neoplasm of sigmoid colon: Secondary | ICD-10-CM

## 2018-05-19 DIAGNOSIS — Z86010 Personal history of colonic polyps: Secondary | ICD-10-CM | POA: Diagnosis not present

## 2018-05-19 MED ORDER — SODIUM CHLORIDE 0.9 % IV SOLN: 500.0000 mL | Freq: Once | INTRAVENOUS | Status: AC

## 2018-05-19 NOTE — Patient Instructions (Signed)
Handout given on polyps  YOU HAD AN ENDOSCOPIC PROCEDURE TODAY AT THE [facility]:   Refer to the procedure report that was given to you for any specific questions about what was found during the examination.  If the procedure report does not answer your questions, please call your gastroenterologist to clarify.  If you requested that your care partner not be given the details of your procedure findings, then the procedure report has been included in a sealed envelope for you to review at your convenience later.  YOU SHOULD EXPECT: Some feelings of bloating in the abdomen. Passage of more gas than usual.  Walking can help get rid of the air that was put into your GI tract during the procedure and reduce the bloating. If you had a lower endoscopy (such as a colonoscopy or flexible sigmoidoscopy) you may notice spotting of blood in your stool or on the toilet paper. If you underwent a bowel prep for your procedure, you may not have a normal bowel movement for a few days.  Please Note:  You might notice some irritation and congestion in your nose or some drainage.  This is from the oxygen used during your procedure.  There is no need for concern and it should clear up in a day or so.  SYMPTOMS TO REPORT IMMEDIATELY:   Following lower endoscopy (colonoscopy or flexible sigmoidoscopy):  Excessive amounts of blood in the stool  Significant tenderness or worsening of abdominal pains  Swelling of the abdomen that is new, acute  Fever of 100F or higher    For urgent or emergent issues, a gastroenterologist can be reached at any hour by calling [phone].   DIET:  We do recommend a small meal at first, but then you may proceed to your regular diet.  Drink plenty of fluids but you should avoid alcoholic beverages for 24 hours.  ACTIVITY:  You should plan to take it easy for the rest of today and you should NOT DRIVE or use heavy machinery until tomorrow (because of the sedation  medicines used during the test).    FOLLOW UP: Our staff will call the number listed on your records the next business day following your procedure to check on you and address any questions or concerns that you may have regarding the information given to you following your procedure. If we do not reach you, we will leave a message.  However, if you are feeling well and you are not experiencing any problems, there is no need to return our call.  We will assume that you have returned to your regular daily activities without incident.  If any biopsies were taken you will be contacted by phone or by letter within the next 1-3 weeks.  Please call us at [phone] if you have not heard about the biopsies in 3 weeks.    SIGNATURES/CONFIDENTIALITY: You and/or your care partner have signed paperwork which will be entered into your electronic medical record.  These signatures attest to the fact that that the information above on your After Visit Summary has been reviewed and is understood.  Full responsibility of the confidentiality of this discharge information lies with you and/or your care-partner. 

## 2018-05-19 NOTE — Op Note (Signed)
[facility] Patient Name: Misty Andrews Procedure Date: 05/19/2018 8:54 AM MRN: [id number] Endoscopist: Milus Banister , MD Age: 63 Referring MD:  Date of Birth: 04-11-1956 Gender: Female Account #: [id number] Procedure:                Colonoscopy Indications:              Screening for colorectal malignant neoplasm Medicines:                Monitored Anesthesia Care Procedure:                Pre-Anesthesia Assessment:                           - Prior to the procedure, a History and Physical                            was performed, and patient medications and                            allergies were reviewed. The patient's tolerance of                            previous anesthesia was also reviewed. The risks                            and benefits of the procedure and the sedation                            options and risks were discussed with the patient.                            All questions were answered, and informed consent                            was obtained. Prior Anticoagulants: The patient has                            taken no previous anticoagulant or antiplatelet                            agents. ASA Grade Assessment: II - A patient with                            mild systemic disease. After reviewing the risks                            and benefits, the patient was deemed in                            satisfactory condition to undergo the procedure.                           After obtaining informed consent, the colonoscope  was passed under direct vision. Throughout the                            procedure, the patient's blood pressure, pulse, and                            oxygen saturations were monitored continuously. The                            Colonoscope was introduced through the anus and                            advanced to the the cecum, identified by                            appendiceal orifice and  ileocecal valve. The                            colonoscopy was performed without difficulty. The                            patient tolerated the procedure well. The quality                            of the bowel preparation was good. The ileocecal                            valve, appendiceal orifice, and rectum were                            photographed. Scope In: 9:04:33 AM Scope Out: 9:17:08 AM Scope Withdrawal Time: 0 hours 7 minutes 58 seconds  Total Procedure Duration: 0 hours 12 minutes 35 seconds  Findings:                 A 3 mm polyp was found in the sigmoid colon. The                            polyp was sessile. The polyp was removed with a                            cold snare. Resection and retrieval were complete.                           The exam was otherwise without abnormality on                            direct and retroflexion views. Complications:            No immediate complications. Estimated blood loss:                            None. Estimated Blood Loss:     Estimated blood loss: none. Impression:               -  One 3 mm polyp in the sigmoid colon, removed with                            a cold snare. Resected and retrieved.                           - The examination was otherwise normal on direct                            and retroflexion views. Recommendation:           - Patient has a contact number available for                            emergencies. The signs and symptoms of potential                            delayed complications were discussed with the                            patient. Return to normal activities tomorrow.                            Written discharge instructions were provided to the                            patient.                           - Resume previous diet.                           - Continue present medications.                           You will receive a letter within 2-3 weeks with the                             pathology results and my final recommendations.                           If the polyp(s) is proven to be 'pre-cancerous' on                            pathology, you will need repeat colonoscopy in 5                            years. If the polyp(s) is NOT 'precancerous' on                            pathology then you should repeat colon cancer                            screening in 10 years with colonoscopy without need  for colon cancer screening by any method prior to                            then (including stool testing). Milus Banister, MD 05/19/2018 9:19:55 AM This report has been signed electronically.

## 2018-05-19 NOTE — Progress Notes (Signed)
Report given to PACU, vss 

## 2018-05-19 NOTE — Progress Notes (Signed)
Pt's states no medical or surgical changes since previsit or office visit. 

## 2018-05-19 NOTE — Progress Notes (Signed)
Called to room to assist during endoscopic procedure.  Patient ID and intended procedure confirmed with present staff. Received instructions for my participation in the procedure from the performing physician.  

## 2018-05-22 ENCOUNTER — Telehealth: Payer: Self-pay

## 2018-05-22 NOTE — Telephone Encounter (Signed)
  Follow up Call-  Call back number 05/19/2018  Post procedure Call Back phone  # [phone]  Permission to leave phone message Yes  Some recent data might be hidden     Patient questions:  Do you have a fever, pain , or abdominal swelling? No. Pain Score  0 *  Have you tolerated food without any problems? Yes.    Have you been able to return to your normal activities? Yes.    Do you have any questions about your discharge instructions: Diet   No. Medications  No. Follow up visit  No.  Do you have questions or concerns about your Care? No.  Actions: * If pain score is 4 or above: No action needed, pain <4.  No problems noted per pt. maw

## 2018-05-29 ENCOUNTER — Encounter: Payer: Self-pay | Admitting: Gastroenterology

## 2018-08-01 ENCOUNTER — Other Ambulatory Visit: Payer: Self-pay

## 2018-08-03 ENCOUNTER — Encounter: Payer: Self-pay | Admitting: Obstetrics & Gynecology

## 2018-08-03 ENCOUNTER — Ambulatory Visit (INDEPENDENT_AMBULATORY_CARE_PROVIDER_SITE_OTHER): Payer: BLUE CROSS/BLUE SHIELD | Admitting: Obstetrics & Gynecology

## 2018-08-03 ENCOUNTER — Other Ambulatory Visit: Payer: Self-pay

## 2018-08-03 VITALS — BP 126/82 | Ht 67.0 in | Wt 185.6 lb

## 2018-08-03 DIAGNOSIS — Z01419 Encounter for gynecological examination (general) (routine) without abnormal findings: Secondary | ICD-10-CM | POA: Diagnosis not present

## 2018-08-03 DIAGNOSIS — D229 Melanocytic nevi, unspecified: Secondary | ICD-10-CM | POA: Diagnosis not present

## 2018-08-03 DIAGNOSIS — N951 Menopausal and female climacteric states: Secondary | ICD-10-CM | POA: Diagnosis not present

## 2018-08-03 DIAGNOSIS — E663 Overweight: Secondary | ICD-10-CM | POA: Diagnosis not present

## 2018-08-03 NOTE — Patient Instructions (Signed)
1. Well female exam with routine gynecological exam Normal gynecologic exam.  Pap test in April 2019 was negative, no indication to repeat this year.  Breast exam normal.  Will schedule a screening mammogram now.  Colonoscopy in 2020 was normal.  Health labs with family physician. - TSH  2. Perimenopause Oligomenorrhea with last menstrual period 6 months ago.  Minimal spotting March 2020.  Will verify menopausal status with an [facility] today.  Precautions discussed with patient, if has an Vayas in the menopausal range and experiences bleeding in the future, recommend calling me for evaluation and investigation. - [place]  3. Change in mole Change in mole at the right anterior neck.  Referred to dermatology.  4. Overweight (BMI 25.0-29.9) Lower calorie/carb diet such as [employer] and aerobic physical activities 5 times a week with weightlifting every 2 days.  Misty Andrews, it was a pleasure seeing you today!  I will inform you of your results as soon as they are available.

## 2018-08-03 NOTE — Progress Notes (Signed)
Misty Andrews [birth year] [id number]   History:    63 y.o. G3P3L3 married.  Has grandchildren.  RP:  Established patient presenting for annual gyn exam   HPI: Perimenopause, oligomenorrhea with last menstrual period July 2019.  Had very light spotting just tinting her vaginal secretions x 2-3 days in 06/2018.  No pelvic pain.  Abstinent.  Breasts normal.  C/O mole at the neck witch increased in size. Urine/BMs normal.  BMI 29.07.  Walking.  Health labs with Fam MD.  Past medical history,surgical history, family history and social history were all reviewed and documented in the EPIC chart.  Gynecologic History No LMP recorded. (Menstrual status: Perimenopausal). Contraception: abstinence Last Pap: 07/2017. Results were: Negative Last mammogram: 07/2017. Results were: Negative Bone Density: Never Colonoscopy: 2020  Obstetric History OB History  Gravida Para Term Preterm AB Living  3 3       3   SAB TAB Ectopic Multiple Live Births               # Outcome Date GA Lbr Len/2nd Weight Sex Delivery Anes PTL Lv  3 Para           2 Para           1 Para              ROS: A ROS was performed and pertinent positives and negatives are included in the history.  GENERAL: No fevers or chills. HEENT: No change in vision, no earache, sore throat or sinus congestion. NECK: No pain or stiffness. CARDIOVASCULAR: No chest pain or pressure. No palpitations. PULMONARY: No shortness of breath, cough or wheeze. GASTROINTESTINAL: No abdominal pain, nausea, vomiting or diarrhea, melena or bright red blood per rectum. GENITOURINARY: No urinary frequency, urgency, hesitancy or dysuria. MUSCULOSKELETAL: No joint or muscle pain, no back pain, no recent trauma. DERMATOLOGIC: No rash, no itching, no lesions. ENDOCRINE: No polyuria, polydipsia, no heat or cold intolerance. No recent change in weight. HEMATOLOGICAL: No anemia or easy bruising or bleeding. NEUROLOGIC: No headache, seizures, numbness, tingling or  weakness. PSYCHIATRIC: No depression, no loss of interest in normal activity or change in sleep pattern.     Exam:   BP 126/82   Ht 5\' 7"  (1.702 m)   Wt 185 lb 9.6 oz (84.2 kg)   BMI 29.07 kg/m   Body mass index is 29.07 kg/m.  General appearance : Well developed well nourished female. No acute distress HEENT: Eyes: no retinal hemorrhage or exudates,  Neck supple, trachea midline, no carotid bruits, no thyroidmegaly.  Dark mole Rt anterior neck. Lungs: Clear to auscultation, no rhonchi or wheezes, or rib retractions  Heart: Regular rate and rhythm, no murmurs or gallops Breast:Examined in sitting and supine position were symmetrical in appearance, no palpable masses or tenderness,  no skin retraction, no nipple inversion, no nipple discharge, no skin discoloration, no axillary or supraclavicular lymphadenopathy Abdomen: no palpable masses or tenderness, no rebound or guarding Extremities: no edema or skin discoloration or tenderness  Pelvic: Vulva: Normal             Vagina: No gross lesions or discharge  Cervix: No gross lesions or discharge  Uterus  AV, normal size, shape and consistency, non-tender and mobile  Adnexa  Without masses or tenderness  Anus: Normal   Assessment/Plan:  63 y.o. female for annual exam   1. Well female exam with routine gynecological exam Normal gynecologic exam.  Pap test in April 2019 was negative, no indication  to repeat this year.  Breast exam normal.  Will schedule a screening mammogram now.  Colonoscopy in 2020 was normal.  Health labs with family physician. - TSH  2. Perimenopause Oligomenorrhea with last menstrual period 6 months ago.  Minimal spotting March 2020.  Will verify menopausal status with an [facility] today.  Precautions discussed with patient, if has an Bridger in the menopausal range and experiences bleeding in the future, recommend calling me for evaluation and investigation. - [place]  3. Change in mole Change in mole at the right  anterior neck.  Referred to dermatology.  4. Overweight (BMI 25.0-29.9) Lower calorie/carb diet such as [employer] and aerobic physical activities 5 times a week with weightlifting every 2 days.  Princess Bruins MD, 10:04 AM 08/03/2018

## 2018-08-04 LAB — FOLLICLE STIMULATING HORMONE: FSH: 67.9 m[IU]/mL

## 2018-08-04 LAB — TSH: TSH: 2.28 mIU/L (ref 0.40–4.50)

## 2018-08-15 ENCOUNTER — Telehealth: Payer: Self-pay | Admitting: *Deleted

## 2018-08-15 NOTE — Telephone Encounter (Signed)
-----   Message from Princess Bruins, MD sent at 08/03/2018 10:42 AM EDT ----- Regarding: Refer to Dermatology Mole at neck, changing/increasing in size.

## 2018-08-15 NOTE — Telephone Encounter (Signed)
Left message for patient to call to see if she prefers be to referred to Dermatology in her area.

## 2018-08-21 NOTE — Telephone Encounter (Signed)
Patient called back and left message stating she is fine with MD in [place]. I called [place] [facility] and they closed due to COVID-19 and plan to re-open on 08/28/18. I will call schedule patient on this date.

## 2018-08-29 NOTE — Telephone Encounter (Signed)
Patient scheduled on 10/10/18 @ 10:00am with Dr.Taffen at [facility] dermatology  notes faxed, left message for patient to call.

## 2018-08-31 NOTE — Telephone Encounter (Signed)
Patient informed with the below.

## 2018-09-25 ENCOUNTER — Telehealth: Payer: Self-pay

## 2018-09-25 NOTE — Telephone Encounter (Signed)
Patient said you told her to report any spotting/bleeding. On Saturday 09/23/18 she reports bleeding like a period.

## 2018-09-26 ENCOUNTER — Other Ambulatory Visit: Payer: Self-pay | Admitting: Obstetrics & Gynecology

## 2018-09-26 DIAGNOSIS — N950 Postmenopausal bleeding: Secondary | ICD-10-CM

## 2018-09-26 NOTE — Telephone Encounter (Signed)
Investigation needed with a Pelvic US.

## 2018-09-26 NOTE — Telephone Encounter (Signed)
Spoke with patient and informed her. Claudia to call her back to schedule. Order placed.

## 2018-09-28 DIAGNOSIS — M25551 Pain in right hip: Secondary | ICD-10-CM | POA: Diagnosis not present

## 2018-09-28 DIAGNOSIS — M1611 Unilateral primary osteoarthritis, right hip: Secondary | ICD-10-CM | POA: Diagnosis not present

## 2018-10-06 ENCOUNTER — Other Ambulatory Visit: Payer: Self-pay

## 2018-10-09 ENCOUNTER — Other Ambulatory Visit: Payer: Self-pay

## 2018-10-09 ENCOUNTER — Ambulatory Visit: Payer: BC Managed Care – PPO | Admitting: Obstetrics & Gynecology

## 2018-10-09 ENCOUNTER — Ambulatory Visit (INDEPENDENT_AMBULATORY_CARE_PROVIDER_SITE_OTHER): Payer: BC Managed Care – PPO

## 2018-10-09 ENCOUNTER — Encounter: Payer: Self-pay | Admitting: Obstetrics & Gynecology

## 2018-10-09 ENCOUNTER — Other Ambulatory Visit: Payer: Self-pay | Admitting: Obstetrics & Gynecology

## 2018-10-09 DIAGNOSIS — R9389 Abnormal findings on diagnostic imaging of other specified body structures: Secondary | ICD-10-CM

## 2018-10-09 DIAGNOSIS — N852 Hypertrophy of uterus: Secondary | ICD-10-CM | POA: Diagnosis not present

## 2018-10-09 DIAGNOSIS — N951 Menopausal and female climacteric states: Secondary | ICD-10-CM

## 2018-10-09 DIAGNOSIS — N914 Secondary oligomenorrhea: Secondary | ICD-10-CM | POA: Diagnosis not present

## 2018-10-09 DIAGNOSIS — D251 Intramural leiomyoma of uterus: Secondary | ICD-10-CM | POA: Diagnosis not present

## 2018-10-09 DIAGNOSIS — N950 Postmenopausal bleeding: Secondary | ICD-10-CM

## 2018-10-09 DIAGNOSIS — N83201 Unspecified ovarian cyst, right side: Secondary | ICD-10-CM

## 2018-10-09 DIAGNOSIS — N83202 Unspecified ovarian cyst, left side: Secondary | ICD-10-CM

## 2018-10-09 MED ORDER — NORETHINDRONE 0.35 MG PO TABS: 1.0000 | ORAL_TABLET | Freq: Every day | ORAL | 4 refills | Status: DC

## 2018-10-09 NOTE — Patient Instructions (Signed)
1. Perimenopause Oligomenorrhea secondary to perimenopause.  Stopped the progestin only birth control pill in April 2020.  Last menstrual.  Sep 23, 2018.  Pelvic ultrasound showing many small uterine fibroids, the endometrial lining is measured at 8.1 mm with negative color flow Doppler, right ovary with a thin-walled echo-free cyst measuring 1.9 cm, left ovary with 2 echo-free cysts measured at 2.1 cm and 2.3 cm, no free fluid in the pelvis.  Patient reassured that all findings are benign appearing for perimenopause, but in the context of her age at 6, decision to repeat a pelvic ultrasound in 3 months to reassess the endometrial lining and the ovaries.  Will restart on the progestin only birth control pill both for contraception and to counterbalance any estrogen from her ovaries.  2. Secondary oligomenorrhea As above. - US Transvaginal Non-OB; Future  3. Bilateral ovarian cysts Probably bilateral ovarian functional cysts.  Will reassess by pelvic ultrasound in 3 months. - US Transvaginal Non-OB; Future  Other orders - norethindrone (MICRONOR) 0.35 MG tablet; Take 1 tablet (0.35 mg total) by mouth daily.  Connee, it was a pleasure seeing you today!

## 2018-10-09 NOTE — Progress Notes (Signed)
    Misty Andrews [birth year] [id number]        63 y.o.  G3P3L3 Married  RP: Perimenopausal bleeding for pelvic ultrasound.  HPI: Continues to have oligomenorrhea associated with perimenopause at age 63.  Last menstrual period was light for 3 days starting Sep 23, 2018.  Decision to recheck by pelvic ultrasound given that this could be postmenopausal bleeding.  No vasomotor menopausal symptoms.  No pelvic pain.  Normal vaginal secretions.   OB History  Gravida Para Term Preterm AB Living  3 3       3   SAB TAB Ectopic Multiple Live Births               # Outcome Date GA Lbr Len/2nd Weight Sex Delivery Anes PTL Lv  3 Para           2 Para           1 Para             Past medical history,surgical history, problem list, medications, allergies, family history and social history were all reviewed and documented in the EPIC chart.   Directed ROS with pertinent positives and negatives documented in the history of present illness/assessment and plan.  Exam:  There were no vitals filed for this visit. General appearance:  Normal  Pelvic US today: T/V and T/A images.  Anteverted uterus measuring 10.59 x 6.8 x 5.45 cm.  Intramural fibroids measured at 2.5 x 1.9 cm, 2.3 x 1.6 cm, 1.3 x 1.5 cm, 2.2 x 1.8 cm, 3.1 x 2.5 cm, 1.8 x 1.8 cm.  Endometrial line prominent measured at 8.1 mm with negative color flow Doppler.  Right ovary with a thin-walled echo-free cyst measuring 1.7 x 1.9 cm.  Left ovary with 2 echo-free cysts measured at 2.1 x 1.4 cm and 2.3 x 1.5 cm. No free fluid in the posterior cul-de-sac.   Assessment/Plan:  63 y.o. G3P3   1. Perimenopause Oligomenorrhea secondary to perimenopause.  Stopped the progestin only birth control pill in April 2020.  Last menstrual.  Sep 23, 2018.  Pelvic ultrasound showing many small uterine fibroids, the endometrial lining is measured at 8.1 mm with negative color flow Doppler, right ovary with a thin-walled echo-free cyst measuring 1.9 cm, left  ovary with 2 echo-free cysts measured at 2.1 cm and 2.3 cm, no free fluid in the pelvis.  Patient reassured that all findings are benign appearing for perimenopause, but in the context of her age at 63, decision to repeat a pelvic ultrasound in 3 months to reassess the endometrial lining and the ovaries.  Will restart on the progestin only birth control pill both for contraception and to counterbalance any estrogen from her ovaries.  2. Secondary oligomenorrhea As above. - US Transvaginal Non-OB; Future  3. Bilateral ovarian cysts Probably bilateral ovarian functional cysts.  Will reassess by pelvic ultrasound in 3 months. - US Transvaginal Non-OB; Future  Other orders - norethindrone (MICRONOR) 0.35 MG tablet; Take 1 tablet (0.35 mg total) by mouth daily.  Counseling on above issues and coordination of care more than 50% for 15 minutes.  Princess Bruins MD, 12:13 PM 10/09/2018

## 2018-10-10 ENCOUNTER — Other Ambulatory Visit: Payer: Self-pay | Admitting: Dermatology

## 2018-10-10 DIAGNOSIS — D229 Melanocytic nevi, unspecified: Secondary | ICD-10-CM | POA: Diagnosis not present

## 2018-10-10 DIAGNOSIS — L719 Rosacea, unspecified: Secondary | ICD-10-CM | POA: Diagnosis not present

## 2018-10-10 DIAGNOSIS — D485 Neoplasm of uncertain behavior of skin: Secondary | ICD-10-CM | POA: Diagnosis not present

## 2019-01-17 ENCOUNTER — Other Ambulatory Visit: Payer: Self-pay

## 2019-01-18 ENCOUNTER — Ambulatory Visit (INDEPENDENT_AMBULATORY_CARE_PROVIDER_SITE_OTHER): Payer: BC Managed Care – PPO

## 2019-01-18 ENCOUNTER — Other Ambulatory Visit: Payer: Self-pay

## 2019-01-18 ENCOUNTER — Ambulatory Visit: Payer: BC Managed Care – PPO | Admitting: Obstetrics & Gynecology

## 2019-01-18 ENCOUNTER — Encounter: Payer: Self-pay | Admitting: Obstetrics & Gynecology

## 2019-01-18 VITALS — BP 134/82

## 2019-01-18 DIAGNOSIS — N914 Secondary oligomenorrhea: Secondary | ICD-10-CM | POA: Diagnosis not present

## 2019-01-18 DIAGNOSIS — N83201 Unspecified ovarian cyst, right side: Secondary | ICD-10-CM

## 2019-01-18 DIAGNOSIS — N951 Menopausal and female climacteric states: Secondary | ICD-10-CM | POA: Diagnosis not present

## 2019-01-18 DIAGNOSIS — N83202 Unspecified ovarian cyst, left side: Secondary | ICD-10-CM | POA: Diagnosis not present

## 2019-01-18 NOTE — Progress Notes (Signed)
    Misty Andrews 01/30/1956 [id number]        63 y.o.  G3P3L3  RP: Bilateral Ovarian Cysts for Pelvic US  HPI: Still in perimenopause at age 63.  Per patient, her mother reached menopause very late in her life.  Well on the progestin only birth control pill.  Had a menstrual period recently with normal flow.  No pelvic pain.   OB History  Gravida Para Term Preterm AB Living  3 3       3   SAB TAB Ectopic Multiple Live Births               # Outcome Date GA Lbr Len/2nd Weight Sex Delivery Anes PTL Lv  3 Para           2 Para           1 Para             Past medical history,surgical history, problem list, medications, allergies, family history and social history were all reviewed and documented in the EPIC chart.   Directed ROS with pertinent positives and negatives documented in the history of present illness/assessment and plan.  Exam:  Vitals:   01/18/19 0902  BP: 134/82   General appearance:  Normal  Pelvic US today: T/V images.  Comparison is made with previous ultrasound in June 2020.  Anteverted uterus measuring 7.8 x 5.37 x 5.23 cm.  Several intramural fibroids are measured with no significant change in size from previous scan.  The 5 fibroid measured are all below 2 cm.  Irregular endometrial lining thinner than on the previous scan measured today at 6.4 mm.  No obvious feeder vessel to the area.  Right ovary with a simple 1.1 cm cystic structure remaining.  Left ovary with no cyst seen today.  No adnexal mass.  No free fluid in the posterior cul-de-sac.   Assessment/Plan:  63 y.o. G3P3   1. Bilateral ovarian cysts Left ovarian cysts completely resolved.  Right ovary with a smaller simple follicle measuring 1.1 cm.  Endometrial lining thinner at 6.4 mm.  Patient reassured.  Will continue on the progestin only birth control pill to protect the endometrium and lower the risk of developing ovarian cysts.  Patient agrees with plan.  Precautions discussed, will call back  if develops pelvic pain or abnormal bleeding.  2. Perimenopause As above.  Follow-up annual gynecologic exam.  Counseling on above issues and coordination of care more than 50% for 15 minutes. Princess Bruins MD, 9:24 AM 01/18/2019

## 2019-01-18 NOTE — Patient Instructions (Signed)
1. Bilateral ovarian cysts Left ovarian cysts completely resolved.  Right ovary with a smaller simple follicle measuring 1.1 cm.  Endometrial lining thinner at 6.4 mm.  Patient reassured.  Will continue on the progestin only birth control pill to protect the endometrium and lower the risk of developing ovarian cysts.  Patient agrees with plan.  Precautions discussed, will call back if develops pelvic pain or abnormal bleeding.  2. Perimenopause As above.  Follow-up annual gynecologic exam.  Misty Andrews, it was a pleasure seeing you today!

## 2019-01-19 DIAGNOSIS — H52223 Regular astigmatism, bilateral: Secondary | ICD-10-CM | POA: Diagnosis not present

## 2019-01-19 DIAGNOSIS — H524 Presbyopia: Secondary | ICD-10-CM | POA: Diagnosis not present

## 2019-06-12 ENCOUNTER — Other Ambulatory Visit: Payer: Self-pay | Admitting: Obstetrics & Gynecology

## 2019-06-12 DIAGNOSIS — Z1231 Encounter for screening mammogram for malignant neoplasm of breast: Secondary | ICD-10-CM

## 2019-07-05 ENCOUNTER — Ambulatory Visit
Admission: RE | Admit: 2019-07-05 | Discharge: 2019-07-05 | Disposition: A | Discharge status: Home / Self Care | Payer: BC Managed Care – PPO | Source: Ambulatory Visit | Attending: Obstetrics & Gynecology | Admitting: Obstetrics & Gynecology

## 2019-07-05 ENCOUNTER — Other Ambulatory Visit: Payer: Self-pay

## 2019-07-05 DIAGNOSIS — Z1231 Encounter for screening mammogram for malignant neoplasm of breast: Secondary | ICD-10-CM

## 2019-07-09 ENCOUNTER — Other Ambulatory Visit: Payer: Self-pay | Admitting: Obstetrics & Gynecology

## 2019-07-09 DIAGNOSIS — R928 Other abnormal and inconclusive findings on diagnostic imaging of breast: Secondary | ICD-10-CM

## 2019-07-16 DIAGNOSIS — H33103 Unspecified retinoschisis, bilateral: Secondary | ICD-10-CM | POA: Diagnosis not present

## 2019-07-16 DIAGNOSIS — H21342 Primary cyst of pars plana, left eye: Secondary | ICD-10-CM | POA: Diagnosis not present

## 2019-07-16 DIAGNOSIS — H35421 Microcystoid degeneration of retina, right eye: Secondary | ICD-10-CM | POA: Diagnosis not present

## 2019-07-24 ENCOUNTER — Ambulatory Visit: Payer: BC Managed Care – PPO

## 2019-07-24 ENCOUNTER — Ambulatory Visit
Admission: RE | Admit: 2019-07-24 | Discharge: 2019-07-24 | Disposition: A | Discharge status: Home / Self Care | Payer: BC Managed Care – PPO | Source: Ambulatory Visit | Attending: Obstetrics & Gynecology | Admitting: Obstetrics & Gynecology

## 2019-07-24 ENCOUNTER — Other Ambulatory Visit: Payer: Self-pay

## 2019-07-24 DIAGNOSIS — R922 Inconclusive mammogram: Secondary | ICD-10-CM | POA: Diagnosis not present

## 2019-07-24 DIAGNOSIS — R928 Other abnormal and inconclusive findings on diagnostic imaging of breast: Secondary | ICD-10-CM

## 2019-08-09 ENCOUNTER — Encounter: Payer: Self-pay | Admitting: Obstetrics & Gynecology

## 2019-08-09 ENCOUNTER — Ambulatory Visit (INDEPENDENT_AMBULATORY_CARE_PROVIDER_SITE_OTHER): Payer: BC Managed Care – PPO | Admitting: Obstetrics & Gynecology

## 2019-08-09 ENCOUNTER — Other Ambulatory Visit: Payer: Self-pay

## 2019-08-09 VITALS — BP 138/88 | Ht 67.0 in | Wt 188.6 lb

## 2019-08-09 DIAGNOSIS — Z01419 Encounter for gynecological examination (general) (routine) without abnormal findings: Secondary | ICD-10-CM | POA: Diagnosis not present

## 2019-08-09 DIAGNOSIS — E663 Overweight: Secondary | ICD-10-CM

## 2019-08-09 DIAGNOSIS — N951 Menopausal and female climacteric states: Secondary | ICD-10-CM

## 2019-08-09 LAB — FOLLICLE STIMULATING HORMONE: FSH: 66 m[IU]/mL

## 2019-08-09 NOTE — Progress Notes (Signed)
Misty Andrews 1955-06-23 [id number]   History:    64 y.o. G3P3L3 married.  Has grandchildren.  RP:  Established patient presenting for annual gyn exam   HPI: Perimenopause, oligomenorrhea with last menstrual periods 10/2018 and 03/2019.  No pelvic pain.  Abstinent.  Breasts normal.  Urine/BMs normal.  BMI 29.54.  Walking. Should start back swimming.  Health labs with Fam MD.   Past medical history,surgical history, family history and social history were all reviewed and documented in the EPIC chart.  Gynecologic History No LMP recorded. (Menstrual status: Perimenopausal).  Obstetric History OB History  Gravida Para Term Preterm AB Living  3 3       3   SAB TAB Ectopic Multiple Live Births               # Outcome Date GA Lbr Len/2nd Weight Sex Delivery Anes PTL Lv  3 Para           2 Para           1 Para              ROS: A ROS was performed and pertinent positives and negatives are included in the history.  GENERAL: No fevers or chills. HEENT: No change in vision, no earache, sore throat or sinus congestion. NECK: No pain or stiffness. CARDIOVASCULAR: No chest pain or pressure. No palpitations. PULMONARY: No shortness of breath, cough or wheeze. GASTROINTESTINAL: No abdominal pain, nausea, vomiting or diarrhea, melena or bright red blood per rectum. GENITOURINARY: No urinary frequency, urgency, hesitancy or dysuria. MUSCULOSKELETAL: No joint or muscle pain, no back pain, no recent trauma. DERMATOLOGIC: No rash, no itching, no lesions. ENDOCRINE: No polyuria, polydipsia, no heat or cold intolerance. No recent change in weight. HEMATOLOGICAL: No anemia or easy bruising or bleeding. NEUROLOGIC: No headache, seizures, numbness, tingling or weakness. PSYCHIATRIC: No depression, no loss of interest in normal activity or change in sleep pattern.     Exam:   BP 138/88   Ht 5\' 7"  (1.702 m)   Wt 188 lb 9.6 oz (85.5 kg)   BMI 29.54 kg/m   Body mass index is 29.54  kg/m.  General appearance : Well developed well nourished female. No acute distress HEENT: Eyes: no retinal hemorrhage or exudates,  Neck supple, trachea midline, no carotid bruits, no thyroidmegaly Lungs: Clear to auscultation, no rhonchi or wheezes, or rib retractions  Heart: Regular rate and rhythm, no murmurs or gallops Breast:Examined in sitting and supine position were symmetrical in appearance, no palpable masses or tenderness,  no skin retraction, no nipple inversion, no nipple discharge, no skin discoloration, no axillary or supraclavicular lymphadenopathy Abdomen: no palpable masses or tenderness, no rebound or guarding Extremities: no edema or skin discoloration or tenderness  Pelvic: Vulva: Normal             Vagina: No gross lesions or discharge  Cervix: No gross lesions or discharge.  Pap reflex done.  Uterus  AV, normal size, shape and consistency, non-tender and mobile  Adnexa  Without masses or tenderness  Anus: Normal   Assessment/Plan:  64 y.o. female for annual exam   1. Encounter for routine gynecological examination with Papanicolaou smear of cervix Normal gynecologic exam in menopause.  Pap reflex done.  Breast exam normal.  Screening mammogram March 2021 was negative.  Colonoscopy January 2020.  Health labs with family physician.  2. Perimenopause Last menstrual periods July 2020 and then December 2020.  [place] in the menopausal range last  year at 67.9.  Pelvic ultrasound September 2020 showed fibroids and a thinner endometrial lining than before at 6.4 mm.  Ovaries were within normal limits.  We will repeat an [facility] today.  Recommend a repeat pelvic ultrasound if FSH is menopausal and recurrence of vaginal bleeding.  Recommend vitamin D supplements, calcium intake of 1200 mg daily and regular weightbearing physical activities. - [place]  3. Overweight (BMI 25.0-29.9) Recommend a lower calorie/carb diet such as [employer].  Aerobic activities 5 times a week and light  weightlifting every 2 days.  Princess Bruins MD, 10:11 AM 08/09/2019

## 2019-08-10 ENCOUNTER — Telehealth: Payer: Self-pay | Admitting: *Deleted

## 2019-08-10 ENCOUNTER — Encounter: Payer: Self-pay | Admitting: Obstetrics & Gynecology

## 2019-08-10 NOTE — Patient Instructions (Signed)
1. Encounter for routine gynecological examination with Papanicolaou smear of cervix Normal gynecologic exam in menopause.  Pap reflex done.  Breast exam normal.  Screening mammogram March 2021 was negative.  Colonoscopy January 2020.  Health labs with family physician.  2. Perimenopause Last menstrual periods July 2020 and then December 2020.  [name] in the menopausal range last year at 67.9.  Pelvic ultrasound September 2020 showed fibroids and a thinner endometrial lining than before at 6.4 mm.  Ovaries were within normal limits.  We will repeat an [facility] today.  Recommend a repeat pelvic ultrasound if FSH is menopausal and recurrence of vaginal bleeding.  Recommend vitamin D supplements, calcium intake of 1200 mg daily and regular weightbearing physical activities. - [place]  3. Overweight (BMI 25.0-29.9) Recommend a lower calorie/carb diet such as [employer].  Aerobic activities 5 times a week and light weightlifting every 2 days.  Misty Andrews, it was a pleasure seeing you today!  I will inform you of your results as soon as they are available.

## 2019-08-10 NOTE — Telephone Encounter (Signed)
-----   Message from Princess Bruins, MD sent at 08/10/2019 10:36 AM EDT ----- [facility] well in menopause range at 68.

## 2019-08-10 NOTE — Telephone Encounter (Signed)
Patient said she assumes this means she can stop the birth control pills correct?

## 2019-08-13 LAB — PAP IG W/ RFLX HPV ASCU

## 2019-11-29 ENCOUNTER — Telehealth: Payer: Self-pay | Admitting: *Deleted

## 2019-11-29 NOTE — Telephone Encounter (Signed)
-----   Message from Gorden Harms sent at 11/29/2019  2:29 PM EDT ----- Regarding: RE: pls clal pt for [place] U/s is 9/2  Pt wants to know should she start back the norethindrone 35mg  went off of it b4 last visit here in April, she does have a 29month supply   ----- Message ----- From: Alen Blew, CMA Sent: 11/29/2019  10:45 AM EDT To: Gorden Harms Subject: pls clal pt for [place]                             Pt in triage VM, with light bleeding she post menopausal.  She needs an [place] then visit with ML.  It can go into Sept per ML. Pls call and scheduleThanks Sharrie Rothman

## 2019-11-29 NOTE — Telephone Encounter (Signed)
See other telephone encounter Thanks Leilanny Fluitt CMA

## 2019-11-29 NOTE — Telephone Encounter (Signed)
Pt called with light bleeding again.  Started yesterday continued to today. Per last note schedule [place] for evaluation. Pt to be called by front desk. KW CMA

## 2019-11-29 NOTE — Telephone Encounter (Signed)
Pt has Ultrasound scheduled for 12/27/19 and a visit with you after for a second episode of post menopausal bleeding but questions if she should go back on norethindrone 35mg  that she took previously.  Please advise. She has the supply of it. Thanks Sharrie Rothman CMA

## 2019-11-30 NOTE — Telephone Encounter (Signed)
Yes, can start back on Norethindrone 35 mcg daily until the Pelvic US 12/27/2019.

## 2019-11-30 NOTE — Telephone Encounter (Signed)
Pt informed KW CMA

## 2019-12-20 ENCOUNTER — Ambulatory Visit: Payer: BLUE CROSS/BLUE SHIELD | Attending: Ophthalmic Plastic and Reconstructive Surgery

## 2019-12-20 DIAGNOSIS — H02403 Unspecified ptosis of bilateral eyelids: Secondary | ICD-10-CM

## 2019-12-20 NOTE — Progress Notes
Abigail Peters is a 64 y.o. female    Assessment and Plan     Problem List        Eye / Vision Problems    1. Ptosis of eyelid            Base Eye Exam     Visual Acuity (Snellen - Linear)       Right Left    Dist sc 20/300     Dist cc  20/25    Dist ph sc 20/125 -1     Correction: Contacts   Only wearing CL OS            Pupils       Pupils    Right PERRL    Left PERRL              Base Eye Exam     Visual Acuity (Snellen - Linear)       Right Left    Dist sc 20/300     Dist cc  20/25    Dist ph sc 20/125 -1     Correction: Contacts   Only wearing CL OS            Pupils       Pupils    Right PERRL    Left PERRL            Slit Lamp and Fundus Exam     External Exam       Right Left    External ptosis, dermatochalasis ptosis, dermatochalasis          Slit Lamp Exam       Right Left    Lids/Lashes Normal Normal    Conjunctiva/Sclera White and quiet White and quiet    Cornea Clear Clear    Anterior Chamber Deep and quiet Deep and quiet    Iris Normal Normal    Lens Clear Clear    Vitreous Normal Normal          Fundus Exam       Right Left    Disc Normal Normal    Macula Normal Normal    Vessels Normal Normal    Periphery Normal Normal            Impression:    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 This has worsened slowly over time. The skin is obstructing vision and functionally impairing her ability to ambulate freely, drive and read.  We discussed the options for management including observation and settled on a reconstructive blepharoplasty surgery.  She understands there is some limit on the overall rehabilitation of the upper eyelid continuum due to the position of the brow bone, fat pad and hairs.      Bilateral upper eyelid ptosis obstructs vision for daily activities including reading.  On examination, the eyelid blocks the visual axis. MRD1 measurement is 1 mm OU. Levator function intact, ocular surface compensated including tarsal conjunctiva, protective mechanisms including orbicularis strength and Bells reflex intact. Based on the vision obstruction and symptoms, surgery is medically indicated to improve vision function for quality of life.        I have booked her for a VFT and external photos.   Orders:  Orders Placed This Encounter   ??? PTOSIS VISUAL FIELD OU (BOTH EYES)     Technician Instructions:     Scheduling Instructions:  Patient Instructions:   ??? EXTERNAL PHOTOS OU (BOTH EYES)     Technician Instructions:     Scheduling Instructions:      Patient Instructions:       Follow ups:  No follow-ups on file.         I discussed the above assessment and plan with the patient. She had the opportunity to ask questions, and her questions and concerns were addressed. She was reminded to call if there is any significant change or worsening in vision, or to get an evaluation, urgently if appropriate.    Author:   Donell Sievert, MD  This note details the assessment and plan of the encounter for this date. For a complete note and record of the encounter, please see the Encounter Summary.

## 2019-12-26 ENCOUNTER — Other Ambulatory Visit: Payer: Self-pay | Admitting: Anesthesiology

## 2019-12-26 DIAGNOSIS — N950 Postmenopausal bleeding: Secondary | ICD-10-CM

## 2019-12-27 ENCOUNTER — Ambulatory Visit (INDEPENDENT_AMBULATORY_CARE_PROVIDER_SITE_OTHER): Payer: BC Managed Care – PPO

## 2019-12-27 ENCOUNTER — Encounter: Payer: Self-pay | Admitting: Obstetrics & Gynecology

## 2019-12-27 ENCOUNTER — Other Ambulatory Visit: Payer: Self-pay

## 2019-12-27 ENCOUNTER — Ambulatory Visit: Payer: BC Managed Care – PPO | Admitting: Obstetrics & Gynecology

## 2019-12-27 DIAGNOSIS — N854 Malposition of uterus: Secondary | ICD-10-CM

## 2019-12-27 DIAGNOSIS — N841 Polyp of cervix uteri: Secondary | ICD-10-CM

## 2019-12-27 DIAGNOSIS — N840 Polyp of corpus uteri: Secondary | ICD-10-CM | POA: Diagnosis not present

## 2019-12-27 DIAGNOSIS — N950 Postmenopausal bleeding: Secondary | ICD-10-CM

## 2019-12-27 NOTE — Progress Notes (Signed)
Misty Andrews 05-27-1955 [id number]        64 y.o.  G3P3L3  RP: Perimenopausal/PMB/Fibroids for Pelvic US  HPI: Perimenopausal.  Was off the Progestin pill x 1 month while investigating to r/o Breast Ca, Rt Dx mammo/US Neg 06/2019.  Back on the Progestin pill now.  Had menstrual-like vaginal bleeding with spotting after.  No pelvic pain currently.  No abnormal vaginal d/c.  Urine/BMs normal.   OB History  Gravida Para Term Preterm AB Living  3 3       3   SAB TAB Ectopic Multiple Live Births               # Outcome Date GA Lbr Len/2nd Weight Sex Delivery Anes PTL Lv  3 Para           2 Para           1 Para             Past medical history,surgical history, problem list, medications, allergies, family history and social history were all reviewed and documented in the EPIC chart.   Directed ROS with pertinent positives and negatives documented in the history of present illness/assessment and plan.  Exam:  There were no vitals filed for this visit. General appearance:  Normal  Pelvic US today: T/V images.  Anteverted uterus enlarged with multiple subserosal and intramural fibroids.  The largest fibroid is measured at 3.29 cm.  The uterus is measured at 8.2 x 5.44 x 4.71 cm.  Symmetrical endometrial lining with sliver of free fluid.  The endometrium is not well delineated, but no mass or thickening seen.  The endometrial lining is measured at 3.71 mm.  Both ovaries are normal in size.  No adnexal mass seen.  No free fluid in the posterior cul-de-sac.  Endometrial Biopsy procedure:  Verbal informed consent obtained.  Vulva normal.  Speculum:  Cervix with a small endocervical polyp and a thickened polyp like posterior lip.  Vagina normal.  Betadine prep.  Hurricane spray.  Endocervical Polyp removed by rotation with a fenestrated clamp.  Bx of the posterior lip of the cervix by rotation with a fenestrated clamp as well.  Endometrial Biopsy done easily with a canula by suction on all  intrauterine surfaces.  Specimens sent separately to pathology.  Hemostasis on the cervix with Silver Nitrate and Monsel solution.  Speculum removed.  Well tolerated, no Cx.  Assessment/Plan:  64 y.o. G3P3   1. Perimenopausal/postmenopausal bleeding On the progestin only pill as patient has been in perimenopause for a few years.  Now at age 42, possible postmenopausal bleeding, rule out endometrial pathology.  Pelvic ultrasound thoroughly reviewed with patient.  Uterus with stable fibroids.  Endometrial lining seen at 3.71 mm, but presence of the sliver of free fluid in the cavity and the endometrial lining is not well delineated.  No mass or thickening seen.  Both ovaries were normal.  No adnexal mass and no free fluid.  Because of the poor delineation of the endometrial lining and the patient's age at 65, decision to proceed with an endometrial biopsy.  Endometrial biopsy done without difficulty or complication.  Specimen sent to pathology. - Pathology Report ([facility])  2. Endocervical polyp On the speculum exam, a small endocervical polyp was present as well as a polyp-like and large posterior lip of the cervix.  Endocervical polyp removed and biopsy of the posterior lip of the cervix taken.  Both specimens sent to pathology together.  Hemostasis with silver  nitrate and Monsel solution.  Well-tolerated and no complication. - Pathology Report ([facility])   Princess Bruins MD, 3:57 PM 12/27/2019

## 2020-01-01 ENCOUNTER — Other Ambulatory Visit: Payer: Self-pay | Admitting: Obstetrics & Gynecology

## 2020-01-01 LAB — TISSUE PATH REPORT

## 2020-01-01 LAB — PATHOLOGY REPORT

## 2020-01-01 NOTE — Telephone Encounter (Signed)
Pharmacy wrote that patient has about a week left on current Rx. Needs new prescription.

## 2020-01-02 ENCOUNTER — Telehealth: Payer: Self-pay

## 2020-01-02 NOTE — Telephone Encounter (Signed)
Calling for biopsy results from biopsy performed last Thursday.

## 2020-01-04 NOTE — Telephone Encounter (Signed)
EBx Benign, no evidence of atypia or malignancy.

## 2020-01-04 NOTE — Telephone Encounter (Signed)
Spoke with patient and informed her. °

## 2020-01-17 ENCOUNTER — Ambulatory Visit: Payer: BLUE CROSS/BLUE SHIELD

## 2020-01-21 ENCOUNTER — Ambulatory Visit: Payer: BLUE CROSS/BLUE SHIELD

## 2020-01-24 ENCOUNTER — Ambulatory Visit: Payer: BLUE CROSS/BLUE SHIELD

## 2020-01-24 ENCOUNTER — Ambulatory Visit: Payer: BLUE CROSS/BLUE SHIELD | Attending: Ophthalmic Plastic and Reconstructive Surgery

## 2020-01-24 DIAGNOSIS — H02403 Unspecified ptosis of bilateral eyelids: Secondary | ICD-10-CM

## 2020-01-24 NOTE — Progress Notes
Abigail Peters is a 64 y.o. female    Assessment and Plan     Problem List        Eye / Vision Problems    1. Ptosis of eyelid             2) Dermatochalasis  Base Eye Exam     Visual Acuity (Snellen - Linear)       Right Left    Dist sc 20/300     Dist cc  20/30 -1    Dist ph cc  20/25 -2    Correction: Contacts          Tonometry (Applanation, 1:58 PM)       Right Left    Pressure 15 15            Slit Lamp and Fundus Exam     External Exam       Right Left    External ptosis, dermatochalasis ptosis, dermatochalasis          Slit Lamp Exam       Right Left    Lids/Lashes Normal Normal    Conjunctiva/Sclera White and quiet White and quiet    Cornea Clear Clear    Anterior Chamber Deep and quiet Deep and quiet    Iris Normal Normal    Lens Clear Clear    Vitreous Normal Normal          Fundus Exam       Right Left    Disc Normal Normal    Macula Normal Normal    Vessels Normal Normal    Periphery Normal Normal            Impression:    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 This has worsened slowly over time. The skin is obstructing vision and functionally impairing her ability to ambulate freely, drive and read.  We discussed the options for management including observation and settled on a reconstructive blepharoplasty surgery.  She understands there is some limit on the overall rehabilitation of the upper eyelid continuum due to the position of the brow bone, fat pad and hairs.      Bilateral upper eyelid ptosis obstructs vision for daily activities including reading.  On examination, the eyelid blocks the visual axis. MRD1 measurement is 1mm OU. Levator function intact, ocular surface compensated including tarsal conjunctiva, protective mechanisms including orbicularis strength and Bells reflex intact. Based on the vision obstruction and symptoms, surgery is medically indicated to improve vision function for quality of life.    Right eye: Significantly decreased superior visual field  Left eye: Significantly decreased superior visual field    She has been listed for bilateral upper eyelid blepharoplasty and bilateral posterior ptosis repair.      Orders:  No orders of the defined types were placed in this encounter.      Follow ups:  No follow-ups on file.         I discussed the above assessment and plan with the patient. She had the opportunity to ask questions, and her questions and concerns were addressed. She was reminded to call if there is any significant change or worsening in vision, or to get an evaluation, urgently if appropriate.    Author:   Donell Sievert, MD  This note details the assessment and plan of the encounter for this date. For a complete  note and record of the encounter, please see the Encounter Summary.

## 2020-01-25 ENCOUNTER — Ambulatory Visit: Payer: BLUE CROSS/BLUE SHIELD

## 2020-01-27 DIAGNOSIS — H02834 Dermatochalasis of left upper eyelid: Secondary | ICD-10-CM

## 2020-01-27 DIAGNOSIS — H02831 Dermatochalasis of right upper eyelid: Secondary | ICD-10-CM

## 2020-01-27 DIAGNOSIS — H02832 Dermatochalasis of right lower eyelid: Secondary | ICD-10-CM

## 2020-01-27 DIAGNOSIS — H02835 Dermatochalasis of left lower eyelid: Secondary | ICD-10-CM

## 2020-01-30 ENCOUNTER — Ambulatory Visit: Payer: BLUE CROSS/BLUE SHIELD

## 2020-01-31 ENCOUNTER — Inpatient Hospital Stay: Payer: BLUE CROSS/BLUE SHIELD | Attending: Ophthalmic Plastic and Reconstructive Surgery

## 2020-01-31 ENCOUNTER — Ambulatory Visit: Payer: BLUE CROSS/BLUE SHIELD

## 2020-02-01 ENCOUNTER — Ambulatory Visit: Payer: BLUE CROSS/BLUE SHIELD

## 2020-02-01 DIAGNOSIS — Z419 Encounter for procedure for purposes other than remedying health state, unspecified: Secondary | ICD-10-CM

## 2020-02-13 ENCOUNTER — Ambulatory Visit: Payer: BLUE CROSS/BLUE SHIELD

## 2020-03-04 ENCOUNTER — Ambulatory Visit: Payer: BLUE CROSS/BLUE SHIELD

## 2020-03-10 ENCOUNTER — Ambulatory Visit: Payer: Commercial Managed Care - Pharmacy Benefit Manager

## 2020-03-23 ENCOUNTER — Institutional Professional Consult (permissible substitution): Payer: Commercial Managed Care - Pharmacy Benefit Manager

## 2020-03-23 DIAGNOSIS — Z419 Encounter for procedure for purposes other than remedying health state, unspecified: Secondary | ICD-10-CM

## 2020-03-24 LAB — COVID-19 PCR: COVID-19 PCR: NOT DETECTED

## 2020-03-24 MED ORDER — ERYTHROMYCIN 5 MG/GM OP OINT
.5 [in_us] | Freq: Four times a day (QID) | OPHTHALMIC | 0 refills | 2.00000 days | Status: AC
Start: 2020-03-24 — End: ?
  Filled 2020-03-25: qty 3.5, 2d supply, fill #0

## 2020-03-24 MED ADMIN — SODIUM CHLORIDE 0.9 % IV SOLN: INTRAVENOUS | @ 17:00:00 | Stop: 2020-03-24 | NDC 00338004904

## 2020-03-24 MED ADMIN — ERYTHROMYCIN 5 MG/GM OP OINT: @ 18:00:00 | Stop: 2020-03-24 | NDC 24208091019

## 2020-03-24 MED ADMIN — PROPOFOL 200 MG/20ML IV EMUL: INTRAVENOUS | @ 18:00:00 | Stop: 2020-03-24 | NDC 63323026929

## 2020-03-24 MED ADMIN — ALFENTANIL HCL 1000 MCG/2ML IV SOLN: INTRAVENOUS | @ 18:00:00 | Stop: 2020-03-24 | NDC 17478006702

## 2020-03-24 MED ADMIN — LIDOCAINE HCL (CARDIAC) 100 MG/5ML IV SOSY: INTRAVENOUS | @ 18:00:00 | Stop: 2020-03-24 | NDC 76329339001

## 2020-03-24 MED ADMIN — LIDOCAINE-EPINEPHRINE 1 %-1:100000 IJ SOLN: @ 18:00:00 | Stop: 2020-03-24 | NDC 00409317801

## 2020-03-24 MED ADMIN — TETRACAINE HCL 0.5 % OP SOLN: @ 18:00:00 | Stop: 2020-03-24 | NDC 00065074114

## 2020-03-24 NOTE — H&P
UPDATED H&P REQUIREMENT    For [facility] and [facility]    WHAT IS THE STATUS OF THE PATIENT'S MOST CURRENT HISTORY AND PHYSICAL?   - The most current H&P is >24 hours and <30 days, and having examined the patient, I attest that there have been no changes. (This suffices as an update to the H&P).      REFER TO MEDICAL STAFF POLICIES REGARDING PRE-PROCEDURE HISTORY AND PHYSICAL EXAMINATION AND UPDATED H&P REQUIREMENTS BELOW:    [place] [facility] and -[facility] Medical Staff Policy 200 - For Patients Undergoing Procedures Requiring Moderate or Deep Sedation, General Anesthesia or Regional Anesthesia    Contents of a History and Physical Examination (H&P):    The H&P shall consist of chief complaint, history of present illness, allergies and medications, relevant social and family history, past medical history, review of systems and physical examination, and assessment and plan appropriate to the patient's age.    For Patients Undergoing Procedures Requiring Moderate or Deep Sedation, General Anesthesia or Regional Anesthesia:    1. An H&P shall be performed within 24 hours prior to the procedure by a qualified member of the medical staff or designee with appropriate privileges, except as noted in item 2 below.    2. If a complete history and physical was performed within thirty (30) calendar days prior to the patient's admission to the Medical Center for elective surgery, a member of the medical staff assumes the responsibility for the accuracy of the clinical information and will need to document in the medical record within twenty-four (24) hours of admission and prior to surgery or major invasive procedure, that they either attest that the history and physical has been reviewed and accepted, or document an update of the original history and physical relevant to the patient's current  clinical status.    3. Providing an H&P for patients undergoing surgery under local anesthesia is at the discretion of the Attending Physician.     4. When a procedure is performed by a dentist, podiatrist or other practitioner who is not privileged to perform an H&P, the anesthesiologist's assessment immediately prior to the procedure will constitute the 24 hour re-assessment.The dentist, podiatrist or other practitioner who is not privileged to perform an H&P will document the history and physical relevant to the procedure.    5. If the H&P and the written informed consent for the surgery or procedure are not recorded in the patient's medical record prior to surgery, the operation shall not be performed unless the attending physician states in writing that such a delay could lead to an adverse event or irreversible damage to the patient.    6. The above requirements shall not preclude the rendering of emergency medical or surgical care to a patient in dire circumstances.

## 2020-03-24 NOTE — Op Note
Preop diagnosis:  1. Bilateral upper dermatochalasis     2. Bilateral lower eyelid fat prolapse         Post op diagnosis:  same    Procedure:   1. Bilateral upper blepharoplasty,               Attending Surgeon: Donell Sievert    Assistant Surgeon:     Anesthesia:  MAC with local    Specimens:  None    EBL: Minimal    Complications: none    Dispo: stable to home    An extensive discussion of the risks, benefits, alternatives of surgery was conducted with the patient.  The patient understood the risks including, but not limited to, bleeding, infection, scarring, asymmetry, poor cosmesis, need for future additional surgery, worsening dry eyes; as well as theoretical risk of inadvertent damage to globe causing loss of vision, double vision, nerve damage, loss of eye. Surgery was undertaken to improve visual field. The patient wished to proceed with surgery.  Consent signed and witnessed.      The patient was brought to the operating room and placed on the table in supine position. The blepharoplasty markings were made with the lid crease at 8 mm and 13 mm from the lower brow/upper eyelid skin transition zone for a total of 20 mm left behind. The markings were made to be symmetric on each side. Surgical timeout was performed.    Both upper eyelids were anesthetized with 2% lidocaine with epinephrine and wydase. A drop of tetracaine was placed in each eye. The patient was then prepped and draped in the usual sterile fashion using betadine solution for oculoplastic surgery.    The following symmetric surgery was performed on both sides. Surgical incision lines were remeasured and made to be symmetric.   A #15 blade was used to incise along the previously drawn upper blepharoplasty markings.  Care was taken to leave at least 20mm of upper eyelid skin on each side to permit full closure of eyelids.  Wescott scissors were used to dissect in the subcutaneous plane.  A skin only flap was excised.  Hemostasis was achieved with monopolar cautery. The medial fat pad was then debulked conservatively with monopolar cautery.       The blepharoplasty skin incisions were then closed with 6-0 express gut suture in running fashion.       The patient tolerated the procedure without complications. The patient left the operating room in good condition. Surgical counts were correct.     The patient will follow up in 5 days and post op instructions were given as well as return precautions.     I, Dr. Donell Sievert, was scrubbed and present for entire duration of the case.      PATIENT: Abigail Peters  [id number]  DOB: 01/13/1956    Preop diagnosis:  1.  bilateral ptosis    Post op diagnosis:  same    Procedure:   1.  bilateral internal levator resection, mullers muscle conjunctival resection    Attending Surgeon: Donell Sievert     Surgeons:   Surgeon(s) and Role:     * Donell Sievert, MD - Primary    Anesthesia:   General, MAC    Anesthesia staff and role  CRNA: Rosita Kea., CRNA  Anesthesiologist: Gayla Medicus., MD    Specimens: * No specimens in log *    EBL: Minimal    Complications: none    Dispo: stable to home  Indications:  Abigail Peters was seen in [facility] complaining of upper eyelid ptosis obstructing vision. For that, this is surgery was offered. The risks, alternatives, and benefits of the therapy were explained to her and she decided to proceed. Full informed consent obtained.    Procedure note:  After surgical timeout the patient was placed under IV sedation as per standard protocol.  2% lidocaine with epinepherine was injected into the surgical field. The face was prepepd and draped in usual oculoplastic manner.    The ptosis surgery was performed. The eyelid was everted over a Desmarres retractor. 3.5 mm above the tarsal margin was marked on the conjunctiva medially and laterally. In these 2 positions conjunctiva, mullers muscle and the posterior levator was elevated on 0.5 forceps. The ptosis clamp was applied. A small pocket in the conjunctiva was made laterally. A 6-0 gut suture was passed from external through to the conjunctival surface and then passed in over and back fashion underneath the ptosis clamp to the medial side. The direction was reversed and it was passed in the intervening spaces and then externalized through the conjunctival pocket laterally. The 2 ends of the suture were tied and cut. The excision was completed with a #15 blade along the under margin of the ptosis clamp. Gentle pressure was applied to the eye. Ointment was placed in the eye.     The patient was woken from IV sedation as per standard protocol. All surgical counts were correct. Patient tolerated the procedure well.    COMPLICATIONS:  None.    ESTIMATED BLOOD LOSS:  Minimal.

## 2020-03-24 NOTE — H&P
UPDATED H&P REQUIREMENT    For [facility] and [facility]    WHAT IS THE STATUS OF THE PATIENT'S MOST CURRENT HISTORY AND PHYSICAL?   - The most current H&P is >24 hours and <30 days, and having examined the patient, I attest that there have been no changes. (This suffices as an update to the H&P).      REFER TO MEDICAL STAFF POLICIES REGARDING PRE-PROCEDURE HISTORY AND PHYSICAL EXAMINATION AND UPDATED H&P REQUIREMENTS BELOW:     [facility] and [facility] Medical Staff Policy 200 - For Patients Undergoing Procedures Requiring Moderate or Deep Sedation, General Anesthesia or Regional Anesthesia    Contents of a History and Physical Examination (H&P):    The H&P shall consist of chief complaint, history of present illness, allergies and medications, relevant social and family history, past medical history, review of systems and physical examination, and assessment and plan appropriate to the patient's age.    For Patients Undergoing Procedures Requiring Moderate or Deep Sedation, General Anesthesia or Regional Anesthesia:    1. An H&P shall be performed within 24 hours prior to the procedure by a qualified member of the medical staff or designee with appropriate privileges, except as noted in item 2 below.    2. If a complete history and physical was performed within thirty (30) calendar days prior to the patient's admission to the Medical Center for elective surgery, a member of the medical staff assumes the responsibility for the accuracy of the clinical information and will need to document in the medical record within twenty-four (24) hours of admission and prior to surgery or major invasive procedure, that they either attest that the history and physical has been reviewed and accepted, or document an update of the original history and physical relevant to the patient's current  clinical status.    3. Providing an H&P for patients undergoing surgery under local anesthesia is at the discretion of the Attending Physician.     4. When a procedure is performed by a dentist, podiatrist or other practitioner who is not privileged to perform an H&P, the anesthesiologist's assessment immediately prior to the procedure will constitute the 24 hour re-assessment.The dentist, podiatrist or other practitioner who is not privileged to perform an H&P will document the history and physical relevant to the procedure.    5. If the H&P and the written informed consent for the surgery or procedure are not recorded in the patient's medical record prior to surgery, the operation shall not be performed unless the attending physician states in writing that such a delay could lead to an adverse event or irreversible damage to the patient.    6. The above requirements shall not preclude the rendering of emergency medical or surgical care to a patient in dire circumstances.

## 2020-03-31 ENCOUNTER — Telehealth: Payer: Commercial Managed Care - Pharmacy Benefit Manager

## 2020-03-31 ENCOUNTER — Ambulatory Visit: Payer: Commercial Managed Care - Pharmacy Benefit Manager | Attending: Ophthalmic Plastic and Reconstructive Surgery

## 2020-03-31 DIAGNOSIS — H02403 Unspecified ptosis of bilateral eyelids: Secondary | ICD-10-CM

## 2020-03-31 NOTE — Progress Notes
Abigail Peters is a 64 y.o. female    Assessment and Plan     Problem List        Eye / Vision Problems    1. Ptosis of eyelid            Base Eye Exam     Visual Acuity (Snellen - Linear)       Right Left    Dist cc 20/60 -2 20/30 -3    Dist ph cc 20/60 -1 20/30 +1    Correction: Glasses          Tonometry (I Care, 9:57 AM)       Right Left    Pressure 19 19          Visual Fields       Left Right     Full Full            Slit Lamp and Fundus Exam     External Exam       Right Left    External ptosis, dermatochalasis ptosis, dermatochalasis          Slit Lamp Exam       Right Left    Lids/Lashes Normal Normal    Conjunctiva/Sclera White and quiet White and quiet    Cornea Clear Clear    Anterior Chamber Deep and quiet Deep and quiet    Iris Normal Normal    Lens Clear Clear    Vitreous Normal Normal          Fundus Exam       Right Left    Disc Normal Normal    Macula Normal Normal    Vessels Normal Normal    Periphery Normal Normal            Impression:    I saw Abigail Peters in the eye clinic. Her eyes are white and quiet and her ptosis surgery has healed well.      Orders:  No orders of the defined types were placed in this encounter.      Follow ups:  No follow-ups on file.         I discussed the above assessment and plan with the patient. She had the opportunity to ask questions, and her questions and concerns were addressed. She was reminded to call if there is any significant change or worsening in vision, or to get an evaluation, urgently if appropriate.    Author:   Donell Sievert, MD  This note details the assessment and plan of the encounter for this date. For a complete note and record of the encounter, please see the Encounter Summary.

## 2020-03-31 NOTE — Telephone Encounter
S/w pt advised her Dr. Chauncey Reading will be in the O.R today at 12pm so he wants to see if she can come in earlier. She stated she can be here at 10am. Postop appt for today at 12pm rescheduled to 10am.

## 2020-05-19 ENCOUNTER — Telehealth: Payer: BLUE CROSS/BLUE SHIELD | Attending: Ophthalmic Plastic and Reconstructive Surgery

## 2020-05-19 DIAGNOSIS — H02403 Unspecified ptosis of bilateral eyelids: Secondary | ICD-10-CM

## 2020-05-19 NOTE — Progress Notes
Abigail Peters is a 65 y.o. female    Assessment and Plan     Problem List        Eye / Vision Problems    1. Ptosis of eyelid     Ptosis of eyelid         Abigail Peters is healing well following bilateral upper eyelid surgery and is pleased with her results.      Orders:  No orders of the defined types were placed in this encounter.      Follow ups:  No follow-ups on file.         I discussed the above assessment and plan with the patient. She had the opportunity to ask questions, and her questions and concerns were addressed. She was reminded to call if there is any significant change or worsening in vision, or to get an evaluation, urgently if appropriate.    Author:   Donell Sievert, MD  This note details the assessment and plan of the encounter for this date. For a complete note and record of the encounter, please see the Encounter Summary.

## 2020-09-24 ENCOUNTER — Encounter: Payer: Self-pay | Admitting: Obstetrics & Gynecology

## 2020-09-24 ENCOUNTER — Ambulatory Visit (INDEPENDENT_AMBULATORY_CARE_PROVIDER_SITE_OTHER): Payer: 59 | Admitting: Obstetrics & Gynecology

## 2020-09-24 ENCOUNTER — Other Ambulatory Visit: Payer: Self-pay

## 2020-09-24 VITALS — BP 138/88 | Ht 66.5 in | Wt 192.0 lb

## 2020-09-24 DIAGNOSIS — E6609 Other obesity due to excess calories: Secondary | ICD-10-CM

## 2020-09-24 DIAGNOSIS — Z6830 Body mass index (BMI) 30.0-30.9, adult: Secondary | ICD-10-CM | POA: Diagnosis not present

## 2020-09-24 DIAGNOSIS — Z780 Asymptomatic menopausal state: Secondary | ICD-10-CM | POA: Diagnosis not present

## 2020-09-24 DIAGNOSIS — Z01419 Encounter for gynecological examination (general) (routine) without abnormal findings: Secondary | ICD-10-CM | POA: Diagnosis not present

## 2020-09-24 NOTE — Progress Notes (Signed)
Misty Andrews December 25, 1955 [id number]   History:    65 y.o. G3P3L74married. Has grandchildren.  HA:[id number] presenting for annual gyn exam   EQA:[id number].  LMP 04/2020.  Still on the Progestin-only BCP.  EBx Benign 12/2019. No pelvic pain. Abstinent. Breasts normal.  Urine/BMs normal. BMI 30.53. Walking. Should start back swimming. Health labs with Fam MD.  Past medical history,surgical history, family history and social history were all reviewed and documented in the EPIC chart.  Gynecologic History No LMP recorded. (Menstrual status: Perimenopausal).  Obstetric History OB History  Gravida Para Term Preterm AB Living  3 3       3   SAB IAB Ectopic Multiple Live Births               # Outcome Date GA Lbr Len/2nd Weight Sex Delivery Anes PTL Lv  3 Para           2 Para           1 Para              ROS: A ROS was performed and pertinent positives and negatives are included in the history.  GENERAL: No fevers or chills. HEENT: No change in vision, no earache, sore throat or sinus congestion. NECK: No pain or stiffness. CARDIOVASCULAR: No chest pain or pressure. No palpitations. PULMONARY: No shortness of breath, cough or wheeze. GASTROINTESTINAL: No abdominal pain, nausea, vomiting or diarrhea, melena or bright red blood per rectum. GENITOURINARY: No urinary frequency, urgency, hesitancy or dysuria. MUSCULOSKELETAL: No joint or muscle pain, no back pain, no recent trauma. DERMATOLOGIC: No rash, no itching, no lesions. ENDOCRINE: No polyuria, polydipsia, no heat or cold intolerance. No recent change in weight. HEMATOLOGICAL: No anemia or easy bruising or bleeding. NEUROLOGIC: No headache, seizures, numbness, tingling or weakness. PSYCHIATRIC: No depression, no loss of interest in normal activity or change in sleep pattern.     Exam:   BP 138/88   Ht 5' 6.5" (1.689 m)   Wt 192 lb (87.1 kg)   BMI 30.53 kg/m   Body mass index is 30.53 kg/m.  General  appearance : Well developed well nourished female. No acute distress HEENT: Eyes: no retinal hemorrhage or exudates,  Neck supple, trachea midline, no carotid bruits, no thyroidmegaly Lungs: Clear to auscultation, no rhonchi or wheezes, or rib retractions  Heart: Regular rate and rhythm, no murmurs or gallops Breast:Examined in sitting and supine position were symmetrical in appearance, no palpable masses or tenderness,  no skin retraction, no nipple inversion, no nipple discharge, no skin discoloration, no axillary or supraclavicular lymphadenopathy Abdomen: no palpable masses or tenderness, no rebound or guarding Extremities: no edema or skin discoloration or tenderness  Pelvic: Vulva: Normal             Vagina: No gross lesions or discharge  Cervix: No gross lesions or discharge  Uterus  AV, normal size, shape and consistency, non-tender and mobile  Adnexa  Without masses or tenderness  Anus: Normal   Assessment/Plan:  65 y.o. female for annual exam   1. Well female exam with routine gynecological exam Normal gynecologic exam in menopause.  No indication for Pap test this year, last Pap test April 2021 was negative.  Breast exam normal.  Will schedule a screening mammogram now.  Colonoscopy January 2020.  Health labs with family physician.  2. Menopause present Stop the Progestin-only BCPs.  No postmenopausal bleeding.  Vitamin D supplements, calcium intake of 1.5 g/day total and regular weightbearing  physical activities.  3. Class 1 obesity due to excess calories without serious comorbidity with body mass index (BMI) of 30.0 to 30.9 in adult Lower calorie/carb diet.  Start back swimming.  Other orders - glucosamine-chondroitin 500-400 MG tablet; Take 1 tablet by mouth 3 (three) times daily. - norethindrone (MICRONOR) 0.35 MG tablet; Take 1 tablet by mouth daily.  Princess Bruins MD, 3:12 PM 09/24/2020

## 2020-09-25 ENCOUNTER — Encounter: Payer: Self-pay | Admitting: Obstetrics & Gynecology

## 2021-02-19 ENCOUNTER — Other Ambulatory Visit: Payer: Self-pay | Admitting: Obstetrics & Gynecology

## 2021-02-19 DIAGNOSIS — Z1231 Encounter for screening mammogram for malignant neoplasm of breast: Secondary | ICD-10-CM

## 2021-02-20 ENCOUNTER — Other Ambulatory Visit: Payer: Self-pay

## 2021-02-20 ENCOUNTER — Ambulatory Visit
Admission: RE | Admit: 2021-02-20 | Discharge: 2021-02-20 | Disposition: A | Discharge status: Home / Self Care | Payer: 59 | Source: Ambulatory Visit | Attending: Obstetrics & Gynecology | Admitting: Obstetrics & Gynecology

## 2021-02-20 DIAGNOSIS — Z1231 Encounter for screening mammogram for malignant neoplasm of breast: Secondary | ICD-10-CM

## 2021-07-11 IMAGING — MG DIGITAL SCREENING BILAT W/ TOMO W/ CAD
8 series · 8 of 24 positions shown · non-contrast
Comparison: Previous exam(s).

CLINICAL DATA: Screening.

EXAM:
DIGITAL SCREENING BILATERAL MAMMOGRAM WITH TOMO AND CAD

[L CC synth-2D]
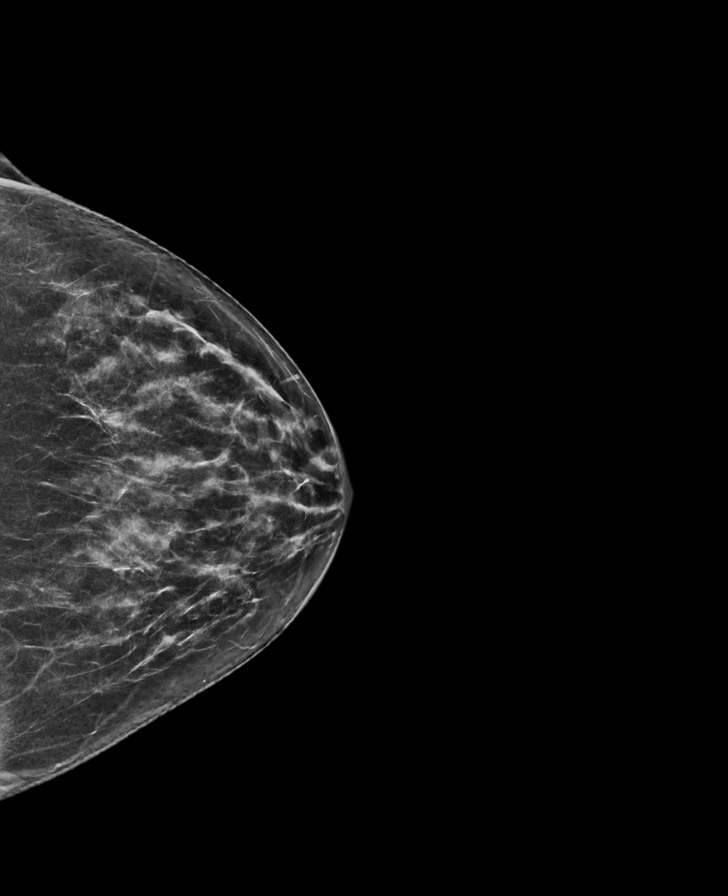

[L MLO synth-2D]
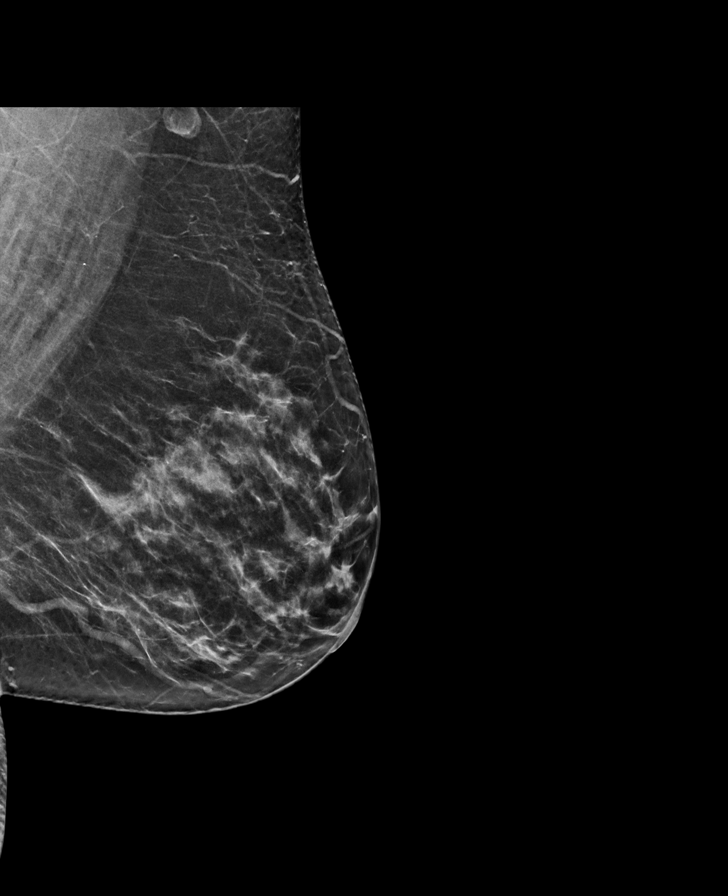

[R CC synth-2D]
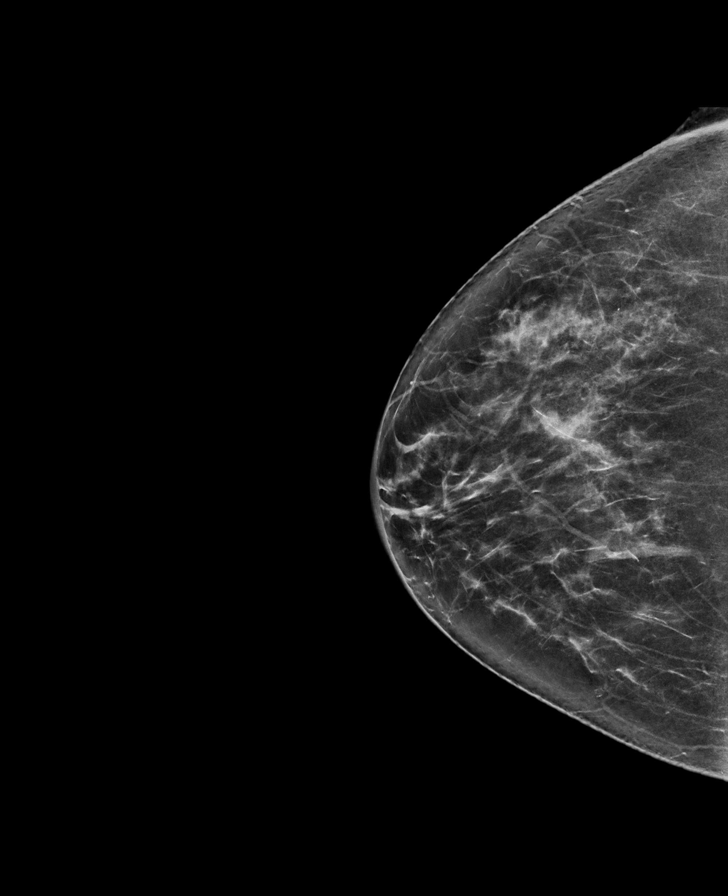

[R MLO synth-2D]
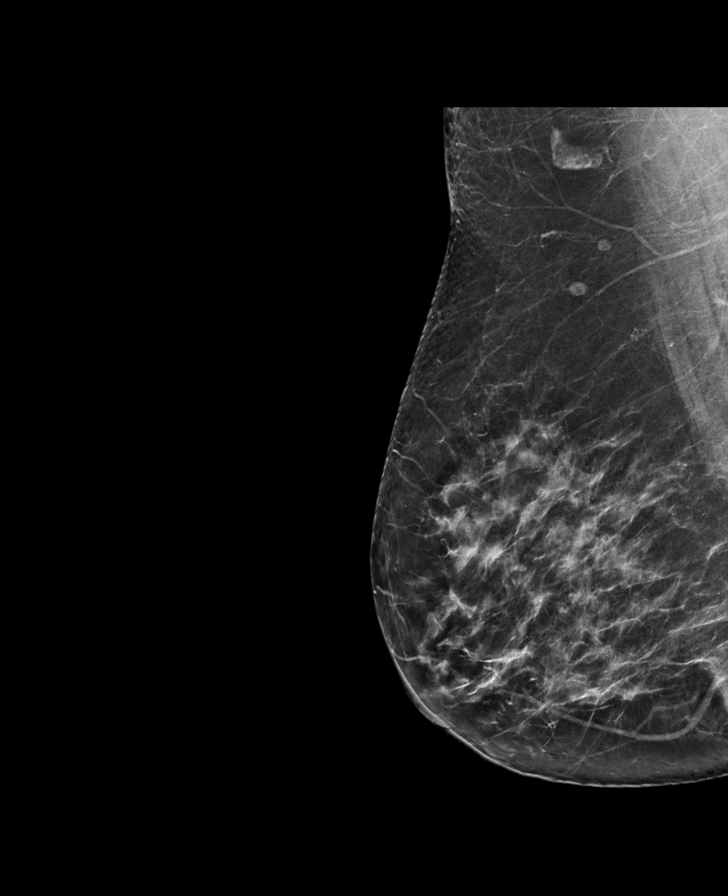

[R MLO tomo · tomo slice 38/75.0]
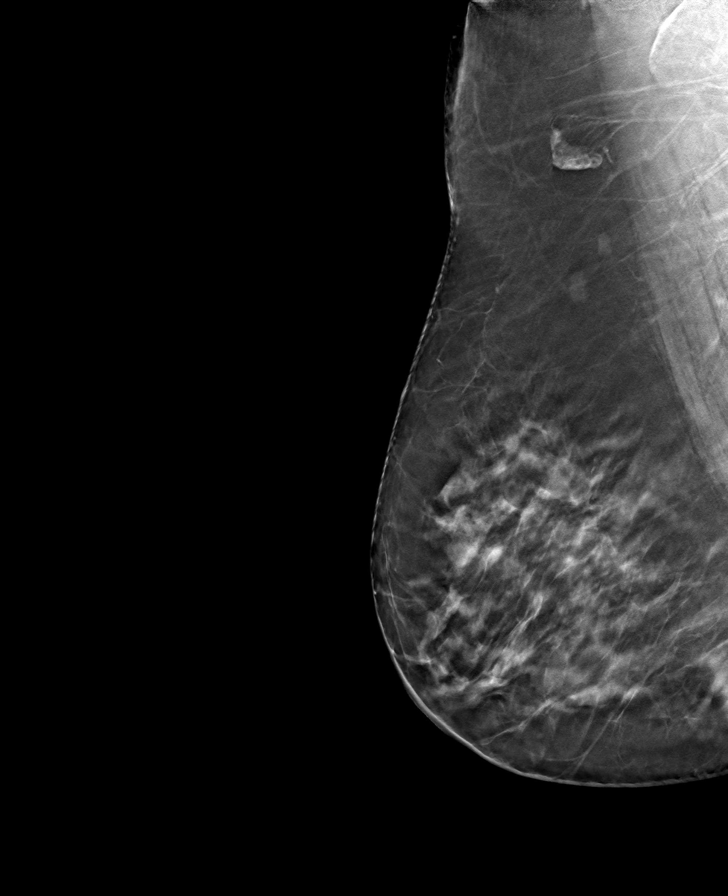

[L CC tomo · tomo slice 34/67.0]
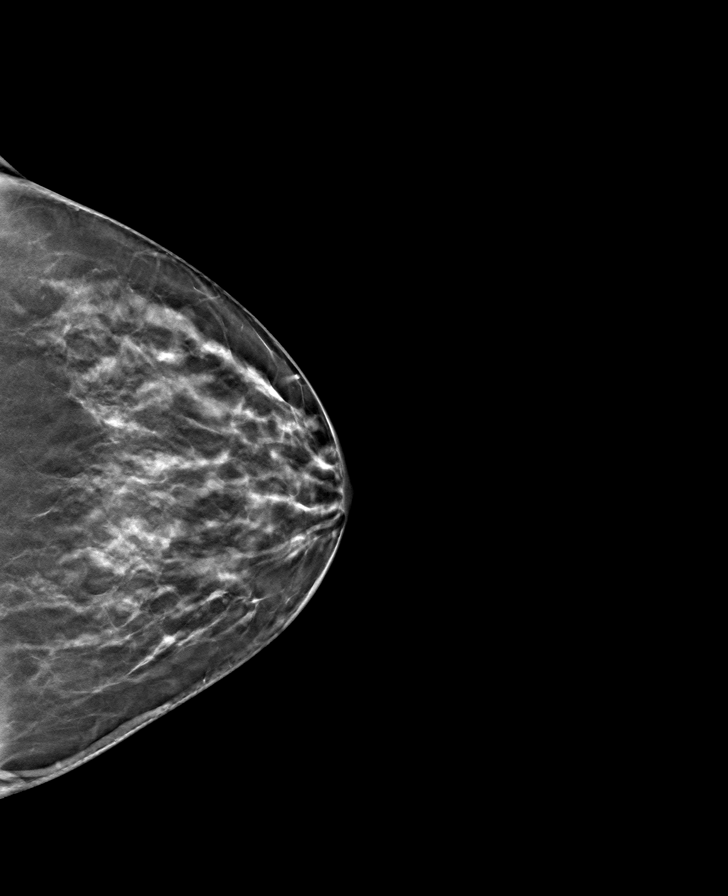

[L MLO tomo · tomo slice 35/70.0]
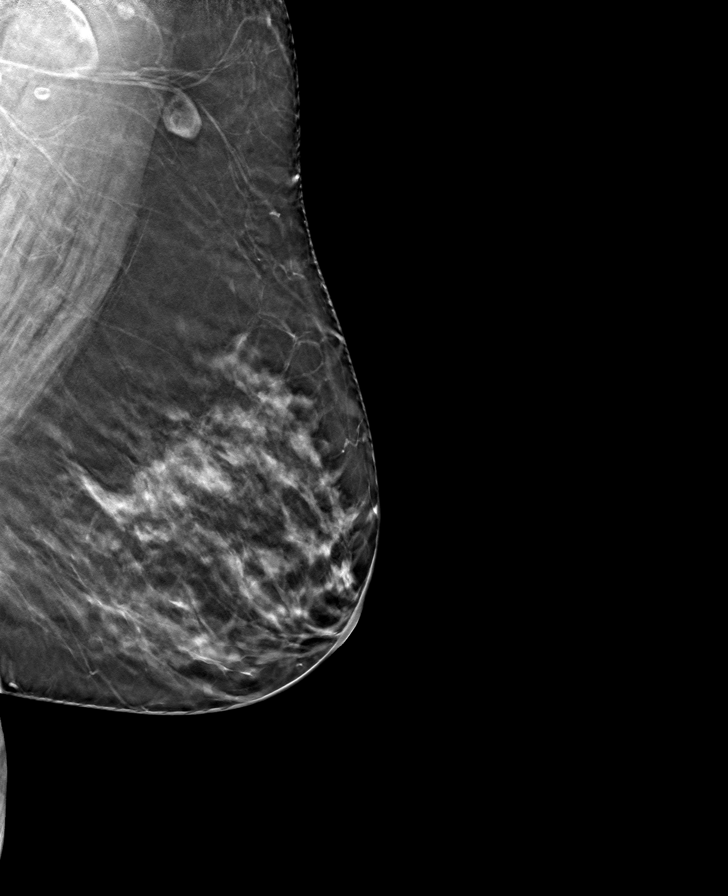

[R CC tomo · tomo slice 37/72.0]
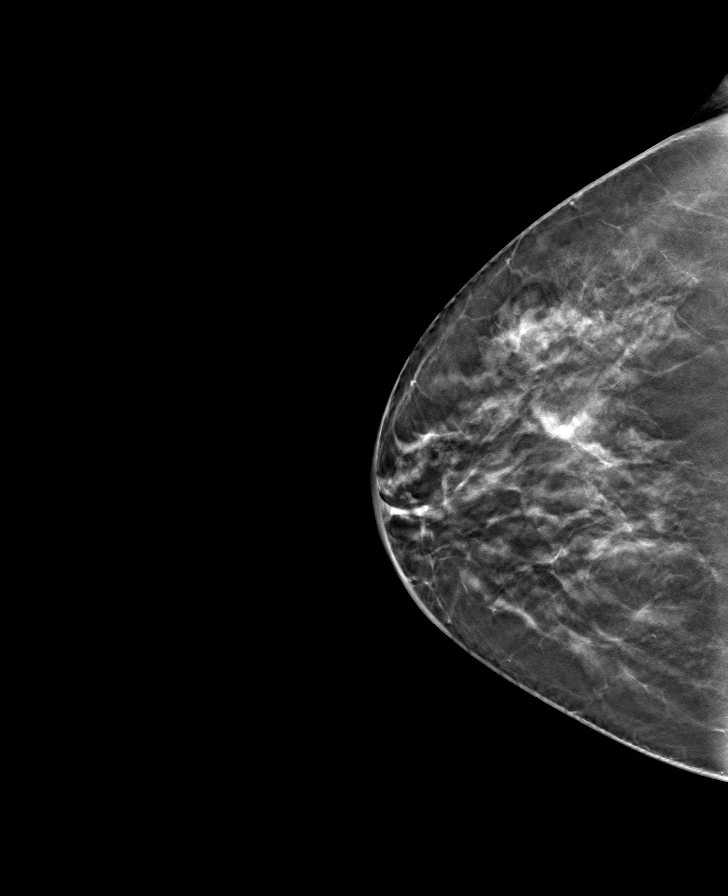

[8 of 24 positions shown; findings below may reference images not displayed]

ACR Breast Density Category c: The breast tissue is heterogeneously
dense, which may obscure small masses.
FINDINGS: In the right breast, a possible asymmetry warrants further
evaluation. In the left breast, no findings suspicious for
malignancy. Images were processed with CAD.
IMPRESSION: Further evaluation is suggested for possible asymmetry in the right
breast.

RECOMMENDATION:
Diagnostic mammogram and possibly ultrasound of the right breast.
(Code:EU-[id number])

The patient will be contacted regarding the findings, and additional
imaging will be scheduled.

BI-RADS CATEGORY  0: Incomplete. Need additional imaging evaluation
and/or prior mammograms for comparison.

## 2023-02-11 ENCOUNTER — Other Ambulatory Visit: Payer: Self-pay | Admitting: Obstetrics and Gynecology

## 2023-02-11 DIAGNOSIS — Z0000 Encounter for general adult medical examination without abnormal findings: Secondary | ICD-10-CM

## 2023-02-14 ENCOUNTER — Ambulatory Visit
Admission: RE | Admit: 2023-02-14 | Discharge: 2023-02-14 | Disposition: A | Discharge status: Home / Self Care | Payer: Medicare Other | Source: Ambulatory Visit | Attending: Obstetrics and Gynecology | Admitting: Obstetrics and Gynecology

## 2023-02-14 DIAGNOSIS — Z0000 Encounter for general adult medical examination without abnormal findings: Secondary | ICD-10-CM

## 2023-03-07 ENCOUNTER — Ambulatory Visit: Payer: 59

## 2023-05-25 NOTE — Progress Notes (Signed)
68 y.o. G3P3 Married Caucasian female here for a breast and pelvic exam.    No GYN concerns today.  Denies any vaginal bleeding.   Denies bladder control problems.   She indicates she had late menopause in her early 5s.  FSH 67.9 on 08/03/18.   Retired from [employer]. Married.   3 children.  Grandchildren.   PCP: Maurice Small, MD (Inactive)   Patient's last menstrual period was 04/06/2019 (approximate).           Sexually active: Yes.    The current method of family planning is PMP .    Menopausal hormone therapy:  n/a Exercising: Yes.     swimming Smoker:  no  OB History     Gravida  3   Para  3   Term      Preterm      AB      Living  3      SAB      IAB      Ectopic      Multiple      Live Births              HEALTH MAINTENANCE: Last 2 paps: 08/09/19 neg History of abnormal Pap or positive HPV:  no Mammogram:  02/14/23 Breast Density Cat c, BI-RADS CAT 1 neg Colonoscopy:  05/19/18 - due in 2030. Bone Density:  n/a  Result  n/a   Immunization History  Administered Date(s) Administered   PFIZER(Purple Top)SARS-COV-2 Vaccination 07/25/2019      reports that she has never smoked. She has never used smokeless tobacco. She reports current alcohol use of about 5.0 standard drinks of alcohol per week. She reports that she does not use drugs.  Past Medical History:  Diagnosis Date   Cervical polyp    Cystitis    Cystocele    Frequency of urination and polyuria    Hemorrhoids    Rubella    Stress incontinence     Past Surgical History:  Procedure Laterality Date   COLONOSCOPY  12/04/2007   CYSTOSCOPY     KNEE SURGERY      Current Outpatient Medications  Medication Sig Dispense Refill   calcium carbonate (TUMS EX) 750 MG chewable tablet Chew 2 tablets by mouth as needed for heartburn.     glucosamine-chondroitin 500-400 MG tablet Take 1 tablet by mouth 3 (three) times daily.     Multiple Vitamins-Minerals (CENTRUM PO)  Take by mouth.     Current Facility-Administered Medications  Medication Dose Route Frequency Provider Last Rate Last Admin   0.9 %  sodium chloride infusion  500 mL Intravenous Once Rachael Fee, MD        ALLERGIES: Patient has no known allergies.  Family History  Adopted: Yes    Review of Systems  All other systems reviewed and are negative.   PHYSICAL EXAM:  BP 132/84 (BP Location: Right Arm, Patient Position: Sitting, Cuff Size: Small)   Pulse 90   Ht 5' 6.5" (1.689 m)   Wt 192 lb (87.1 kg)   LMP 04/06/2019 (Approximate)   SpO2 100%   BMI 30.53 kg/m     General appearance: alert, cooperative and appears stated age Head: normocephalic, without obvious abnormality, atraumatic Neck: no adenopathy, supple, symmetrical, trachea midline and thyroid normal to inspection and palpation Lungs: clear to auscultation bilaterally Breasts: normal appearance, no masses or tenderness, No nipple retraction or dimpling, No nipple discharge or bleeding, No axillary adenopathy Heart: regular  rate and rhythm Abdomen: soft, non-tender; no masses, no organomegaly Extremities: extremities normal, atraumatic, no cyanosis or edema Skin: skin color, texture, turgor normal. No rashes or lesions Lymph nodes: cervical, supraclavicular, and axillary nodes normal. Neurologic: grossly normal  Pelvic: External genitalia:  no lesions              No abnormal inguinal nodes palpated.              Urethra:  normal appearing urethra with no masses, tenderness or lesions              Bartholins and Skenes: normal                 Vagina: normal appearing vagina with normal color and discharge, no lesions.  Good support.               Cervix: no lesions              Pap taken: Yes.   Bimanual Exam:  Uterus:  normal size, contour, position, consistency, mobility, non-tender              Adnexa: no mass, fullness, tenderness              Rectal exam: Yes.  .  Confirms.              Anus:  normal  sphincter tone, no lesions  Chaperone was present for exam:  Warren Lacy, CMA  ASSESSMENT: Encounter for breast and pelvic exam.  Cystocele and stress incontinence on chart review.  Good support noted today.   Late menopause.  Osteoporosis screening declined.  PLAN: Mammogram screening discussed. Self breast awareness reviewed. Pap and HRV collected:  yes Guidelines for Calcium, Vitamin D, regular exercise program including cardiovascular and weight bearing exercise. Medication refills:  NA Declines BMD.  She will let me know if she wishes to proceed with this.  Follow up:  2 years and prn.

## 2023-06-08 ENCOUNTER — Other Ambulatory Visit (HOSPITAL_COMMUNITY)
Admission: RE | Admit: 2023-06-08 | Discharge: 2023-06-08 | Disposition: A | Discharge status: Home / Self Care | Payer: Medicare Other | Source: Ambulatory Visit | Attending: Obstetrics and Gynecology | Admitting: Obstetrics and Gynecology

## 2023-06-08 ENCOUNTER — Encounter: Payer: Self-pay | Admitting: Obstetrics and Gynecology

## 2023-06-08 ENCOUNTER — Ambulatory Visit: Payer: Medicare Other | Admitting: Obstetrics and Gynecology

## 2023-06-08 VITALS — BP 132/84 | HR 90 | Ht 66.5 in | Wt 192.0 lb

## 2023-06-08 DIAGNOSIS — Z1152 Encounter for screening for COVID-19: Secondary | ICD-10-CM | POA: Insufficient documentation

## 2023-06-08 DIAGNOSIS — Z124 Encounter for screening for malignant neoplasm of cervix: Secondary | ICD-10-CM | POA: Insufficient documentation

## 2023-06-08 DIAGNOSIS — Z01419 Encounter for gynecological examination (general) (routine) without abnormal findings: Secondary | ICD-10-CM | POA: Diagnosis not present

## 2023-06-08 NOTE — Patient Instructions (Signed)

## 2023-06-10 LAB — CYTOLOGY - PAP
Comment: NEGATIVE
Diagnosis: NEGATIVE
High risk HPV: NEGATIVE

## 2023-06-11 ENCOUNTER — Encounter: Payer: Self-pay | Admitting: Obstetrics and Gynecology

## 2024-05-17 ENCOUNTER — Other Ambulatory Visit: Payer: Self-pay | Admitting: Obstetrics and Gynecology

## 2024-05-17 DIAGNOSIS — Z1231 Encounter for screening mammogram for malignant neoplasm of breast: Secondary | ICD-10-CM

## 2024-06-14 ENCOUNTER — Ambulatory Visit
